# Patient Record
Sex: Female | Born: 1990 | Race: Black or African American | Hispanic: No | Marital: Single | Smoking: Former smoker
Health system: Southern US, Community
[De-identification: ages and names within clinical notes are randomized; demographics above are authoritative.]

## PROBLEM LIST (undated history)

## (undated) ENCOUNTER — Inpatient Hospital Stay (HOSPITAL_COMMUNITY): Payer: Self-pay

## (undated) DIAGNOSIS — D573 Sickle-cell trait: Secondary | ICD-10-CM

## (undated) DIAGNOSIS — A749 Chlamydial infection, unspecified: Secondary | ICD-10-CM

## (undated) HISTORY — PX: NO PAST SURGERIES: SHX2092

---
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
## 2012-09-07 ENCOUNTER — Emergency Department (INDEPENDENT_AMBULATORY_CARE_PROVIDER_SITE_OTHER)
Admission: EM | Admit: 2012-09-07 | Discharge: 2012-09-07 | Disposition: A | Discharge status: Home / Self Care | Payer: Medicaid Other | Source: Home / Self Care | Attending: Family Medicine | Admitting: Family Medicine

## 2012-09-07 ENCOUNTER — Encounter (HOSPITAL_COMMUNITY): Payer: Self-pay | Admitting: Emergency Medicine

## 2012-09-07 DIAGNOSIS — R109 Unspecified abdominal pain: Secondary | ICD-10-CM

## 2012-09-07 LAB — POCT URINALYSIS DIP (DEVICE)
Glucose, UA: NEGATIVE mg/dL
Ketones, ur: NEGATIVE mg/dL
Leukocytes, UA: NEGATIVE
Nitrite: NEGATIVE
Protein, ur: NEGATIVE mg/dL
Specific Gravity, Urine: 1.02 (ref 1.005–1.030)
Urobilinogen, UA: 0.2 mg/dL (ref 0.0–1.0)
pH: 5 (ref 5.0–8.0)

## 2012-09-07 MED ORDER — TAMSULOSIN HCL 0.4 MG PO CAPS: 0.4000 mg | ORAL_CAPSULE | Freq: Every day | ORAL | Status: DC

## 2012-09-07 MED ORDER — HYDROCODONE-ACETAMINOPHEN 10-500 MG PO TABS: 1.0000 | ORAL_TABLET | Freq: Three times a day (TID) | ORAL | Status: DC | PRN

## 2012-09-07 MED ORDER — METRONIDAZOLE 500 MG PO TABS: 500.0000 mg | ORAL_TABLET | Freq: Two times a day (BID) | ORAL | Status: DC

## 2012-09-07 MED ORDER — POLYETHYLENE GLYCOL 3350 17 G PO PACK: 17.0000 g | PACK | Freq: Every day | ORAL | Status: DC

## 2012-09-07 NOTE — ED Notes (Signed)
Reports abd pain with back pain for week now.  Denies discharge and problems with urine.   No OTC medications.

## 2012-09-07 NOTE — ED Provider Notes (Signed)
History     CSN: [id number]  Arrival date & time 09/07/12  1352   First MD Initiated Contact with Patient 09/07/12 1414      Chief Complaint  Patient presents with  . Abdominal Pain    (Consider location/radiation/quality/duration/timing/severity/associated sxs/prior treatment) HPI Comments: 21 year old female with no significant past medical history. Here complaining of right flank pain for about 5 days. Patient reports that her pain started in the right mid back and has been radiating down towards right flank in the last #2 days. Patient denies pelvic pain or vaginal discharge although reports bad fishlike smell in her genital area, urine and underwear. Denies dysuria or blood in her urine. No prior history of kidney stones. No frequency or urgency. No low abdominal discomfort. No nausea vomiting or diarrhea. No fever or chills.   History reviewed. No pertinent past medical history.  History reviewed. No pertinent past surgical history.  No family history on file.  History  Substance Use Topics  . Smoking status: Never Smoker   . Smokeless tobacco: Not on file  . Alcohol Use: No    OB History    Grav Para Term Preterm Abortions TAB SAB Ect Mult Living                  Review of Systems  HENT: Negative for sore throat.   Gastrointestinal: Positive for constipation. Negative for nausea and vomiting.  Genitourinary: Positive for flank pain. Negative for dysuria, frequency, vaginal discharge, vaginal pain, menstrual problem and pelvic pain.  Skin: Negative for rash.  Neurological: Negative for dizziness and headaches.    Allergies  Review of patient's allergies indicates no known allergies.  Home Medications   Current Outpatient Rx  Name  Route  Sig  Dispense  Refill  . HYDROCODONE-ACETAMINOPHEN 10-500 MG PO TABS   Oral   Take 1 tablet by mouth every 8 (eight) hours as needed for pain.   15 tablet   0   . METRONIDAZOLE 500 MG PO TABS   Oral   Take 1 tablet  (500 mg total) by mouth 2 (two) times daily.   14 tablet   0   . TAMSULOSIN HCL 0.4 MG PO CAPS   Oral   Take 1 capsule (0.4 mg total) by mouth daily.   7 capsule   0     BP 132/62  Pulse 76  Temp 98.8 F (37.1 C) (Oral)  Resp 19  SpO2 100%  Physical Exam  Nursing note and vitals reviewed. Constitutional: She is oriented to person, place, and time. She appears well-developed and well-nourished.  HENT:  Head: Normocephalic and atraumatic.  Mouth/Throat: Oropharynx is clear and moist. No oropharyngeal exudate.  Eyes: Conjunctivae normal are normal. No scleral icterus.  Neck: No thyromegaly present.  Cardiovascular: Normal heart sounds.   Pulmonary/Chest: Effort normal and breath sounds normal. She has no rales. She exhibits no tenderness.  Abdominal: Soft. She exhibits no distension and no mass. There is no rebound and no guarding.       Reported mild flank tenderness with deep palpation. impresss palpable stools in left colon with no tenderness.  Lymphadenopathy:    She has no cervical adenopathy.  Neurological: She is alert and oriented to person, place, and time.  Skin: No rash noted.    ED Course  Procedures (including critical care time)  Labs Reviewed  POCT URINALYSIS DIP (DEVICE) - Abnormal; Notable for the following:    Hgb urine dipstick TRACE (*)  All other components within normal limits  POCT PREGNANCY, URINE   No results found.   1. Flank pain       MDM  21 year old female here with right back and right flank pain for 5 days. No other symptoms. Negative pregnancy test. Trace hemoglobin with no LE or nitrates in the urine. A possible nephrolithiasis. Decided to treat with Vicodin and Flomax for few days, also prescribed MiraLax for constipation and Flagyl for possible BV. Patient was provided with a straining cup. Asked to go to the emergency department if new symptoms like vomiting or fever or persistent worsening pain otherwise followup with  urology as needed.       Sharin Grave, MD 09/09/12 1018

## 2012-12-07 ENCOUNTER — Emergency Department (HOSPITAL_COMMUNITY)
Admission: EM | Admit: 2012-12-07 | Discharge: 2012-12-07 | Disposition: A | Discharge status: Other Acute Inpatient Hospital | Payer: Self-pay | Attending: Emergency Medicine | Admitting: Emergency Medicine

## 2012-12-07 ENCOUNTER — Inpatient Hospital Stay (HOSPITAL_COMMUNITY)
Admission: AD | Admit: 2012-12-07 | Discharge: 2012-12-10 | DRG: 885 | Disposition: A | Discharge status: Home / Self Care | Payer: Federal, State, Local not specified - Other | Attending: Psychiatry | Admitting: Psychiatry

## 2012-12-07 ENCOUNTER — Encounter (HOSPITAL_COMMUNITY): Payer: Self-pay | Admitting: Emergency Medicine

## 2012-12-07 ENCOUNTER — Other Ambulatory Visit: Payer: Self-pay

## 2012-12-07 DIAGNOSIS — R443 Hallucinations, unspecified: Secondary | ICD-10-CM | POA: Insufficient documentation

## 2012-12-07 DIAGNOSIS — T394X2A Poisoning by antirheumatics, not elsewhere classified, intentional self-harm, initial encounter: Secondary | ICD-10-CM | POA: Insufficient documentation

## 2012-12-07 DIAGNOSIS — T40601A Poisoning by unspecified narcotics, accidental (unintentional), initial encounter: Secondary | ICD-10-CM | POA: Insufficient documentation

## 2012-12-07 DIAGNOSIS — F489 Nonpsychotic mental disorder, unspecified: Secondary | ICD-10-CM | POA: Insufficient documentation

## 2012-12-07 DIAGNOSIS — N390 Urinary tract infection, site not specified: Secondary | ICD-10-CM | POA: Insufficient documentation

## 2012-12-07 DIAGNOSIS — R45851 Suicidal ideations: Secondary | ICD-10-CM

## 2012-12-07 DIAGNOSIS — R45850 Homicidal ideations: Secondary | ICD-10-CM | POA: Insufficient documentation

## 2012-12-07 DIAGNOSIS — F329 Major depressive disorder, single episode, unspecified: Principal | ICD-10-CM | POA: Diagnosis present

## 2012-12-07 DIAGNOSIS — F32A Depression, unspecified: Secondary | ICD-10-CM | POA: Diagnosis present

## 2012-12-07 LAB — COMPREHENSIVE METABOLIC PANEL
ALT: 11 U/L (ref 0–35)
AST: 17 U/L (ref 0–37)
Albumin: 3.7 g/dL (ref 3.5–5.2)
BUN: 10 mg/dL (ref 6–23)
CO2: 21 mEq/L (ref 19–32)
Calcium: 9 mg/dL (ref 8.4–10.5)
Chloride: 104 mEq/L (ref 96–112)
Creatinine, Ser: 0.85 mg/dL (ref 0.50–1.10)
GFR calc Af Amer: >90 mL/min (ref >90)
GFR calc non Af Amer: >90 mL/min (ref >90)
Glucose, Bld: 95 mg/dL (ref 70–99)
Sodium: 137 mEq/L (ref 135–145)
Total Bilirubin: 0.3 mg/dL (ref 0.3–1.2)
Total Protein: 6.9 g/dL (ref 6.0–8.3)

## 2012-12-07 LAB — ACETAMINOPHEN LEVEL
Acetaminophen (Tylenol), Serum: <15 ug/mL (ref 10–30)
Acetaminophen (Tylenol), Serum: <15 ug/mL (ref 10–30)

## 2012-12-07 LAB — CBC WITH DIFFERENTIAL/PLATELET
Basophils Absolute: 0 10*3/uL (ref 0.0–0.1)
Basophils Relative: 0 % (ref 0–1)
Eosinophils Absolute: 0 10*3/uL (ref 0.0–0.7)
HCT: 34.1 % — ABNORMAL LOW (ref 36.0–46.0)
Hemoglobin: 11.5 g/dL — ABNORMAL LOW (ref 12.0–15.0)
Lymphocytes Relative: 29 % (ref 12–46)
Lymphs Abs: 1.8 10*3/uL (ref 0.7–4.0)
MCH: 25.7 pg — ABNORMAL LOW (ref 26.0–34.0)
MCHC: 33.7 g/dL (ref 30.0–36.0)
Monocytes Absolute: 0.4 10*3/uL (ref 0.1–1.0)
Monocytes Relative: 7 % (ref 3–12)
Neutro Abs: 4.1 10*3/uL (ref 1.7–7.7)
Neutrophils Relative %: 64 % (ref 43–77)
Platelets: 272 10*3/uL (ref 150–400)
RBC: 4.48 MIL/uL (ref 3.87–5.11)
RDW: 14.7 % (ref 11.5–15.5)

## 2012-12-07 LAB — URINALYSIS, ROUTINE W REFLEX MICROSCOPIC
Bilirubin Urine: NEGATIVE
Glucose, UA: NEGATIVE mg/dL
Hgb urine dipstick: NEGATIVE
Ketones, ur: >80 mg/dL — AB
Protein, ur: NEGATIVE mg/dL
Specific Gravity, Urine: 1.024 (ref 1.005–1.030)
Urobilinogen, UA: 1 mg/dL (ref 0.0–1.0)
pH: 5.5 (ref 5.0–8.0)

## 2012-12-07 LAB — RAPID URINE DRUG SCREEN, HOSP PERFORMED
Amphetamines: NOT DETECTED
Barbiturates: NOT DETECTED
Benzodiazepines: NOT DETECTED
Cocaine: NOT DETECTED
Tetrahydrocannabinol: NOT DETECTED

## 2012-12-07 LAB — URINE MICROSCOPIC-ADD ON

## 2012-12-07 MED ORDER — MAGNESIUM HYDROXIDE 400 MG/5ML PO SUSP: 30.0000 mL | Freq: Every day | ORAL | Status: DC | PRN

## 2012-12-07 MED ORDER — TRAZODONE HCL 50 MG PO TABS
50.0000 mg | ORAL_TABLET | Freq: Every evening | ORAL | Status: DC | PRN
  Filled 2012-12-07 (×7): qty 1

## 2012-12-07 MED ORDER — CIPROFLOXACIN HCL 500 MG PO TABS
500.0000 mg | ORAL_TABLET | Freq: Every day | ORAL | Status: AC
  Administered 2012-12-08 – 2012-12-10 (×3): 500 mg via ORAL
  Filled 2012-12-07 (×4): qty 1

## 2012-12-07 MED ORDER — CEPHALEXIN 500 MG PO CAPS
500.0000 mg | ORAL_CAPSULE | Freq: Four times a day (QID) | ORAL | Status: DC
  Administered 2012-12-07 (×2): 500 mg via ORAL
  Filled 2012-12-07 (×2): qty 1

## 2012-12-07 MED ORDER — ACETAMINOPHEN 325 MG PO TABS
650.0000 mg | ORAL_TABLET | Freq: Four times a day (QID) | ORAL | Status: DC | PRN
  Administered 2012-12-08 – 2012-12-10 (×5): 650 mg via ORAL

## 2012-12-07 MED ORDER — ALUM & MAG HYDROXIDE-SIMETH 200-200-20 MG/5ML PO SUSP: 30.0000 mL | ORAL | Status: DC | PRN
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
## 2012-12-07 NOTE — ED Notes (Signed)
Dr. Shirlee Latch at bedside talking with mother.
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
## 2012-12-07 NOTE — Progress Notes (Signed)
Patient ID: Brenda Simon, female   DOB: 1990-11-30, 22 y.o.   MRN: [id number]  Admission Note: Patient is a 22 yo female admitted voluntarily to [facility] for SI/HI. Pt states she made suicidal statements and sent a video to friends stating she was going to kill herself after a breakup with her boyfriend. Pt took 3 oxycodone tabs and had a plan to hang herself. Pt denies any thoughts of such now and says that is was in the heat of the moment and that she did not mean to say those things. Pt states she and her ex BF have broken up several times before and they have a rocky relationship but this time it is final. Pt states she is a full time student at [employer] and is falling behind in her schoolwork and it is a big stressor for her. Pt also says that she lives with her mother and sister and they are having to move into a new home on March 2nd which is overwhelming. Pt pleasant and appropriate during admission and verbally contracts for safety. Report given to Dois Davenport, [place].

## 2012-12-07 NOTE — ED Notes (Signed)
[id number]<ON> Expected date:<BR> Expected time:<BR> Means of arrival:<BR> Comments:<BR> 21yo took 4 hydrocodones/SI

## 2012-12-07 NOTE — ED Notes (Signed)
Patient in blue scrubs and red socks.  

## 2012-12-07 NOTE — Tx Team (Signed)
Initial Interdisciplinary Treatment Plan  PATIENT STRENGTHS: (choose at least two) Ability for insight Average or above average intelligence Capable of independent living Communication skills Financial means General fund of knowledge Motivation for treatment/growth Physical Health Supportive family/friends  PATIENT STRESSORS: Educational concerns   PROBLEM LIST: Problem List/Patient Goals Date to be addressed Date deferred Reason deferred Estimated date of resolution  Depression 12/07/12     Suicide Risk 12/07/12                                                DISCHARGE CRITERIA:  Reduction of life-threatening or endangering symptoms to within safe limits  PRELIMINARY DISCHARGE PLAN: Outpatient therapy Return to previous work or school arrangements  PATIENT/FAMIILY INVOLVEMENT: This treatment plan has been presented to and reviewed with the patient, Brenda Simon, and/or family member, .  The patient and family have been given the opportunity to ask questions and make suggestions.  Renaee Munda 12/07/2012, 10:52 PM

## 2012-12-07 NOTE — ED Notes (Signed)
Patient and belongings wanded by security. Patient has 1 bag of belongings located at nurses station.

## 2012-12-07 NOTE — ED Provider Notes (Signed)
I saw and evaluated the patient, reviewed the resident's note and I agree with the findings and plan.  Intentional overdose of 2-3 tablets of what appears to be hydrocodone/acetaminophen around 10 AM. Suicidal thoughts persists.  Labs including four-hour Tylenol level. Discuss with act team  Glynn Octave, MD 12/07/12 [phone]

## 2012-12-07 NOTE — ED Notes (Signed)
Patient has nose ring and belly button ring in. RN Tracie aware

## 2012-12-07 NOTE — ED Notes (Signed)
Patient to be discharge to [facility] with security and nurse tech.

## 2012-12-07 NOTE — BHH Counselor (Signed)
Brenda Simon, Child psychotherapist at [employer], submitted Pt for admission to [facility]. Thurman Coyer, [facility] confirmed bed availability. Donell Sievert, PA reviewed clinical information and accepted Pt to the service of Dr. Henrietta Dine, room 504-1.   Harlin Rain Patsy [place], LPC, [facility] Assessment Counselor

## 2012-12-07 NOTE — ED Notes (Signed)
Unknown amount of Hydrocodone 10/500 taken about 1000. Pt has been A&O x4 and was able to transfer herself to the stretcher upon arrival. EMS VS BP 108/68, HR 100 Resp 16, O2 Sat 94-98% on RA
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
## 2012-12-08 DIAGNOSIS — F329 Major depressive disorder, single episode, unspecified: Principal | ICD-10-CM

## 2012-12-08 NOTE — Progress Notes (Signed)
[facility] LCSW Aftercare Discharge Planning Group Note  12/08/2012 12:41 PM  Participation Quality:  Appropriate  Affect:  Appropriate and Depressed  Cognitive:  Appropriate  Insight:  Engaged  Engagement in Group:  Engaged  Modes of Intervention:  Exploration, Problem-solving, Rapport Building and Support  Summary of Progress/Problems:  Patient advised of admitting due to a suicide attempt.  She shared she is overwhelmed with school, break up with boyfriend and losing her job due to pending legal involvement.  She currently denies SI//HI and rates depression and anxiety at six.  She advised of living with her mother and not having outpatient follow up.  Daily workbook provided.   Wynn Banker 12/08/2012, 12:41 PM
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
## 2012-12-08 NOTE — Progress Notes (Signed)
Patient ID: Brenda Simon, female   DOB: [birth year], 22 y.o.   MRN: [id number] D: Pt. Introduced to Clinical research associate by admission nurse. A: Writer encouraged pt. To verbalize concerns. R: Pt. Reports no concerns at this time. Pt. Went to bed.

## 2012-12-08 NOTE — [facility] Counselor (Signed)
Adult Comprehensive Assessment  Patient ID: Brenda Simon, female   DOB: 01-08-1991, 22 y.o.   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 Family Relationships: Denies Surveyor, quantity / Lack of resources (include bankruptcy): Difficulty due to loss of job Housing / Lack of housing: Lives with mother and sister Physical health (include injuries & life threatening diseases): None Social relationships: None Substance abuse: None Bereavement / Loss: None  Living/Environment/Situation:  Living Arrangements: Parent Living conditions (as described by patient or guardian): Good How long has patient lived in current situation?: All of her life What is atmosphere in current home: Comfortable;Supportive  Family History:  Marital status: Single Does patient have children?: No  Childhood History:  By whom was/is the patient raised?: Mother/father and step-parent Additional childhood history information: Patient reports having a good childhood Description of patient's relationship with caregiver when they were a child: Good relationship with mother and stepfather Patient's description of current relationship with people who raised him/her: Good Does patient have siblings?: Yes Number of Siblings: 1 Description of patient's current relationship with siblings: Patient has a good relationship with sister. She reports brother is deceased. Did patient suffer any verbal/emotional/physical/sexual abuse as a child?: No Did patient suffer from severe childhood neglect?: No Has patient ever been sexually abused/assaulted/raped as an adolescent or adult?: No Was the patient ever a victim of a crime or a disaster?: No Witnessed domestic violence?: No Has patient been effected by domestic violence as an adult?:  No  Education:  Highest grade of school patient has completed: 12th Currently a student?: Yes How long has the patient attended?: [employer] Learning disability?: No  Employment/Work Situation:   Employment situation: Unemployed Patient's job has been impacted by current illness: No What is the longest time patient has a held a job?: 10 months Where was the patient employed at that time?: [employer] Has patient ever been in the [employer]?: No Has patient ever served in Buyer, retail?: No  Financial Resources:   Surveyor, quantity resources: No income Does patient have a Lawyer or guardian?: No  Alcohol/Substance Abuse:   What has been your use of drugs/alcohol within the last 12 months?: None If attempted suicide, did drugs/alcohol play a role in this?: No Alcohol/Substance Abuse Treatment Hx: Denies past history Has alcohol/substance abuse ever caused legal problems?: No  Social Support System:   Forensic psychologist System: None Type of faith/religion: None  Leisure/Recreation:   Leisure and Hobbies: Sherri Rad out with cousins  Strengths/Needs:   What things does the patient do well?: Unable to identify In what areas does patient struggle / problems for patient: Has problems talking with people/family about problems.  Discharge Plan:   Does patient have access to transportation?: Yes Will patient be returning to same living situation after discharge?: Yes Currently receiving community mental health services: No If no, would patient like referral for services when discharged?: Yes (What county?) (Will refer for services in [facility]) Does patient have financial barriers related to discharge medications?: Yes Patient description of barriers related to discharge medications: Patient does not have insurance or money to pay for medications.  Summary/Recommendations:  Brenda Simon is as 22 years old African American female admitted with Major Depression Disorder.  She will  Patient will benefit from crisis stabilization, evaluation for medication management, psycho education groups for coping skills development, group  therapy and assistance with discharge planning.     Brenda Simon, Joesph July. 12/08/2012

## 2012-12-08 NOTE — Progress Notes (Signed)
Adult Psychoeducational Group Note  Date:  12/08/2012 Time:  2000  Group Topic/Focus:  Karaoke  Participation Level:  Active  Participation Quality:  Appropriate, Attentive and Supportive  Affect:  Appropriate  Cognitive:  Alert and Appropriate  Insight: Good  Engagement in Group:  Engaged and Supportive  Modes of Intervention:  Support  Additional Comments:  Pt participated by singing a song and being supportive of her peers.   Humberto Seals Monique 12/08/2012, 10:19 PM

## 2012-12-08 NOTE — [facility] Suicide Risk Assessment (Signed)
Suicide Risk Assessment  Admission Assessment     Nursing information obtained from:  Patient Demographic factors:  Adolescent or young adult Current Mental Status:  NA (pt denies SI) Loss Factors:  Loss of significant relationship Historical Factors:  NA Risk Reduction Factors:  Sense of responsibility to family;Living with another person, especially a relative;Positive social support;Positive therapeutic relationship;Positive coping skills or problem solving skills  CLINICAL FACTORS:   Depression:   Anhedonia Hopelessness Impulsivity Insomnia Severe  COGNITIVE FEATURES THAT CONTRIBUTE TO RISK:  Thought constriction (tunnel vision)    SUICIDE RISK:   Moderate:  Frequent suicidal ideation with limited intensity, and duration, some specificity in terms of plans, no associated intent, good self-control, limited dysphoria/symptomatology, some risk factors present, and identifiable protective factors, including available and accessible social support.  PLAN OF CARE: Start medication as needed. Encourage patient to attend groups.  I certify that inpatient services furnished can reasonably be expected to improve the patient's condition.  Nilah Belcourt 12/08/2012, 11:32 AM

## 2012-12-08 NOTE — Progress Notes (Signed)
[facility] LCSW Aftercare Discharge Planning Group Note  12/08/2012 10:29 AM  Participation Quality:  Appropriate  Affect:  Anxious, Depressed and Flat  Cognitive:  Alert and Appropriate  Insight:  Developing/Improving  Engagement in Group:  Developing/Improving  Modes of Intervention:  Education, Limit-setting, Problem-solving, Rapport Building and Support  Summary of Progress/Problems:  Patient reports admitting to hospital following a suicide attempt.  She reports being stressed as a result of pressures from work, school and breakup with boyfriend.  Patient shared she was terminated from work due to having outstanding legal charges.  She denies SI/HI today and rates symptoms at six.  She denies alcohol/other drug use.  Patient advised she will return to mother's home at discharge.  Patient will need a referral for outpatient services.  Wynn Banker 12/08/2012, 10:29 AM

## 2012-12-08 NOTE — Progress Notes (Signed)
[facility] LCSW Group Therapy  Living a Balanced Life 1:15 - 2:30PM  12/08/2012 3:14 PM  Type of Therapy:  Group Therapy  Participation Level:  Active 3 Participation Quality:  Appropriate  Affect:  Appropriate, Depressed, Flat and Tearful  Cognitive:  Alert and Appropriate  Insight:  Engaged  Engagement in Therapy:  Engaged  Modes of Intervention:  Discussion, Education, Exploration, Problem-solving, Rapport Building and Support  Summary of Progress/Problems:  Patient struggled to identify what a balanced life would look like for her.  She shared was spending a lot of time at work and on school but not spending time on taking care of herself.  Wynn Banker 12/08/2012, 3:14 PM

## 2012-12-08 NOTE — Progress Notes (Signed)
Patient ID: Brenda Simon, female   DOB: 1991/05/05, 22 y.o.   MRN: [id number] D: pt. Visible on the unit, mostly on the phone. Pt. Reports depression at "4" of 10. Pt. Reports good family support, was visited tonight by "mom, aunts, cousins". Pt. Also reports coping skills i.e "listening to music, and praying a lot" Pt. Also requesting to signs 72 hr. Discharge release. A: Writer encouraged pt. To tap into positive reinforcer's and as she move forward to recovery. Staff will monitor q32min for safety. Writer encouraged group. Pt. Attended karaoke. R: pt. Is safe on the unit. Pt. Attended karaoke.

## 2012-12-08 NOTE — Progress Notes (Signed)
D: Patient appropriate and cooperative with staff and peers. Patient's affect/mood is sad and depressed. Patient complained of abdominal cramping 6/10. She reported on the self inventory sheet her appetite is good, energy level is low and ability to pay attention is good. Patient rated depression and feelings of hopelessness "7".  A: Support and encouragement provided to patient. Administered scheduled medications per ordering MD. Monitor Q15 minute checks for safety.  R: Patient receptive. Denies SI/HI/AVH. Patient remains safe.

## 2012-12-09 LAB — URINE CULTURE: Colony Count: >=100000

## 2012-12-09 NOTE — Progress Notes (Signed)
[facility] LCSW Group Therapy  Feelings Around Relapse1:15 - 2:30PM  12/09/2012 3:39 PM  Type of Therapy:  Group Therapy  Participation Level:  Active 3 Participation Quality:  Appropriate  Affect:  Appropriate  Cognitive:  Alert and Appropriate  Insight:  Engaged  Engagement in Therapy:  Engaged  Modes of Intervention:  Discussion, Education, Exploration, Problem-solving, Rapport Building and Support  Summary of Progress/Problems:  Patient shared relapse for her would be holding things in and not talking with family/friends about problems.  She shared she is looking forward to  Meeting with a counselor.  Wynn Banker 12/09/2012, 3:39 PM

## 2012-12-09 NOTE — Progress Notes (Signed)
[facility] INPATIENT:  Family/Significant Other Suicide Prevention Education  Suicide Prevention Education:  Education Completed; Shanedra, Lave, [phone] has been identified by the patient as the family member/significant other with whom the patient will be residing, and identified as the person(s) who will aid the patient in the event of a mental health crisis (suicidal ideations/suicide attempt).  With written consent from the patient, the family member/significant other has been provided the following suicide prevention education, prior to the and/or following the discharge of the patient.  The suicide prevention education provided includes the following:  Suicide risk factors  Suicide prevention and interventions  National Suicide Hotline telephone number  [facility] assessment telephone number  [facility] Emergency Assistance 911  [facility] and/or Residential Mobile Crisis Unit telephone number  Request made of family/significant other to:  Remove weapons (e.g., guns, rifles, knives), all items previously/currently identified as safety concern.  Mother reports there are no guns in the home.  Remove drugs/medications (over-the-counter, prescriptions, illicit drugs), all items previously/currently identified as a safety concern.  The family member/significant other verbalizes understanding of the suicide prevention education information provided.  The family member/significant other agrees to remove the items of safety concern listed above.  Wynn Banker 12/09/2012, 12:22 PM

## 2012-12-09 NOTE — Progress Notes (Signed)
Adult Psychoeducational Group Note  Date:  12/09/2012 Time:  11:51 AM  Group Topic/Focus:  Relapse Prevention Planning:   The focus of this group is to define relapse and discuss the need for planning to combat relapse.  Participation Level:  Minimal  Participation Quality:  Drowsy  Affect:  Blunted and depressed  Cognitive:  Oriented  Insight: Good  Engagement in Group:  Limited  Modes of Intervention:  Discussion and Education  Additional Comments:  Pt said her goal is to open up and talk to people. Pt feels that she needs to not keep things in so much.   Martine Trageser T 12/09/2012, 11:51 AM

## 2012-12-09 NOTE — Progress Notes (Signed)
D: Patient cooperative with staff and peers. Patient's affect/mood is appropriate to circumstance. Anxious at times. Patient verbalized that she "feels a whole lot better today" and that the groups are helpful. She reported on the self inventory sheet that her appetite is good, energy level is normal and ability to pay attention is good. Patient rated depression and feelings of hopelessness "0". Attending groups.  A: Support and encouragement provided to patient. Scheduled medications administered per MD orders. Maintain Q15 minute checks for safety.  R: Patient receptive. Denies SI/HI/AVH. Patient remains safe.

## 2012-12-09 NOTE — Progress Notes (Signed)
Patient has been up and active on the unit, attended group this evening and has voiced no complaints. Patient  Reports that her goal is to find coping skills and to talk with someone when feeling stressed. Patient currently denies having pain, -si/hi/a/v hall. Support and encouragement offered, safety maintained on unit, will continue to monitor.

## 2012-12-09 NOTE — Progress Notes (Signed)
Recreation Therapy Notes    Date: 02.28.2014 Time: 10:30am Location:[address]      Group Topic/Focus: Decision Making  Participation Level: Active  Participation Quality: Appropriate  Affect: Flat  Cognitive: Appropriate   Additional Comments: Patient given mind mapping activity. Patient was asked to identify the reason for admission and identify factors that played into reason for admission. As group patients identified coping mechanisms. Patient shared that her reason for admission is depression. Patient stated that it is hard to communicate how she is feeling to others and this makes her depression worse. Patient stated she might try the positive affirmation coping mechanism. LRT encouraged patient to learn as many coping skills as possible while here in the hospital.   Jearl Klinefelter, LRT/CTRS    Jearl Klinefelter 12/09/2012 4:10 PM

## 2012-12-09 NOTE — Progress Notes (Signed)
[facility] LCSW Aftercare Discharge Planning Group Note  12/09/2012 10:07 AM  Participation Quality:  Appropriate  Affect:  Appropriate  Cognitive:  Appropriate  Insight:  Engaged  Engagement in Group:  Engaged  Modes of Intervention:  Exploration, Problem-solving, Rapport Building and Support  Summary of Progress/Problems:  Patient advised of feeling much better today.  She denies SI/HI and rates all symptoms at zero.  Patient shared she plans to move forward with her life and put the past behind her.   Daily workbook provided.   Wynn Banker 12/09/2012, 10:07 AM

## 2012-12-09 NOTE — Progress Notes (Signed)
Patient ID: Brenda Simon, female   DOB: 02-15-1991, 22 y.o.   MRN: [id number] Pt. Signed 72 hour discharge release. Writer reviewed request and notified pt. That physician will review in a.m.

## 2012-12-09 NOTE — Progress Notes (Signed)
[facility] Adult Case Management Discharge Plan :  Will you be returning to the same living situation after discharge: Yes,  Patient to return to mother's home. At discharge, do you have transportation home?:Yes,  Patient to arrange transportation home. Do you have the ability to pay for your medications:  No.  Patient does not want to be on medications.  Release of information consent forms completed and in the chart;  Patient's signature needed at discharge.  Patient to Follow up at: Follow-up Information   Follow up with [facility] On 12/16/2012. (You are scheduled with [facility] on Friday, March 7 , 2014 at 11:00 AM)    Contact information:   [address] [place], [place]   [address]  [phone]      Patient denies SI/HI:   Yes,  Patient is not endorsing SI/HI or thoughts of self harm.    Safety Planning and Suicide Prevention discussed:  Yes,  Reviewed during aftercare group.  Wynn Banker 12/09/2012, 10:45 AM

## 2012-12-09 NOTE — Tx Team (Signed)
Interdisciplinary Treatment Plan Update   Date Reviewed:  12/09/2012  Time Reviewed:  10:24 AM  Progress in Treatment:   Attending groups: Yes Participating in groups: Yes Taking medication as prescribed: Yes  Tolerating medication: Yes Family/Significant other contact made: Contact to be made with mother. Patient understands diagnosis: Yes  Discussing patient identified problems/goals with staff: Yes Medical problems stabilized or resolved: Yes Denies suicidal/homicidal ideation: Yes Patient has not harmed self or others: Yes  For review of initial/current patient goals, please see plan of care.  Estimated Length of Stay:  1 - day  Reasons for Continued Hospitalization:  Anxiety Depression Suicidal ideation  New Problems/Goals identified:    Discharge Plan or Barriers:   Home with [facility]  Additional Comments:  Patient reports admitted to hospital for SI.  She rates all symptoms at zero.  Paitent shared she does not want to be started on medications but is willing to see a therapist.    Attendees:  Patient: Brenda Simon 12/09/2012 10:24 AM   Signature: Patrick North, MD 12/09/2012 10:24 AM  Signature: 12/09/2012 10:24 AM  Signature: Harold Barban, RN 12/09/2012 10:24 AM  Signature: 12/09/2012 10:24 AM  Signature:  12/09/2012 10:24 AM  Signature:  Juline Patch, LCSW 12/09/2012 10:24 AM  Signature: Silverio Decamp, PMH-NP 12/09/2012 10:24 AM  Signature:  12/09/2012 10:24 AM  Signature:    Signature:    Signature:    Signature:      Scribe for Treatment Team:   Juline Patch,  12/09/2012 10:24 AM

## 2012-12-09 NOTE — Progress Notes (Signed)
[facility] MD Progress Note  12/09/2012 11:55 AM Brenda Simon  MRN:  [id number] Subjective:  Patient continues to be depressed and anxious. States it is difficult for her to be around people and she needs to learn coping skills. Continues to be resistant to medication. Diagnosis:   Axis I: Major Depression, single episode Axis II: Deferred Axis III:  Past Medical History  Diagnosis Date  . Medical history non-contributory    Axis IV: other psychosocial or environmental problems Axis V: 41-50 serious symptoms  ADL's:  Intact  Sleep: Fair  Appetite:  Fair   Psychiatric Specialty Exam: Review of Systems  Constitutional: Negative.   HENT: Negative.   Eyes: Negative.   Respiratory: Negative.   Cardiovascular: Negative.   Gastrointestinal: Negative.   Genitourinary: Negative.   Skin: Negative.   Neurological: Negative.   Endo/Heme/Allergies: Negative.   Psychiatric/Behavioral: Positive for depression. The patient is nervous/anxious.     Blood pressure 104/70, pulse 112, temperature 98.2 F (36.8 C), temperature source Oral, resp. rate 16, height 5\' 3"  (1.6 m), weight 71.215 kg (157 lb), last menstrual period 11/12/2012.Body mass index is 27.82 kg/(m^2).  General Appearance: Casual  Eye Contact::  Fair  Speech:  Slow  Volume:  Decreased  Mood:  Anxious and Dysphoric  Affect:  Constricted and Flat  Thought Process:  Circumstantial  Orientation:  Full (Time, Place, and Person)  Thought Content:  Rumination  Suicidal Thoughts:  No  Homicidal Thoughts:  No  Memory:  Immediate;   Fair Recent;   Fair Remote;   Fair  Judgement:  Fair  Insight:  Present  Psychomotor Activity:  Decreased  Concentration:  Fair  Recall:  Fair  Akathisia:  No  Handed:  Right  AIMS (if indicated):     Assets:  Communication Skills Desire for Improvement Housing Social Support  Sleep:  Number of Hours: 6.25   Current Medications: Current Facility-Administered Medications  Medication Dose  Route Frequency Provider Last Rate Last Dose  . acetaminophen (TYLENOL) tablet 650 mg  650 mg Oral Q6H PRN Kerry Hough, PA   650 mg at 12/08/12 2128  . alum & mag hydroxide-simeth (MAALOX/MYLANTA) 200-200-20 MG/5ML suspension 30 mL  30 mL Oral Q4H PRN Kerry Hough, PA      . ciprofloxacin (CIPRO) tablet 500 mg  500 mg Oral Daily Kerry Hough, PA   500 mg at 12/09/12 0751  . magnesium hydroxide (MILK OF MAGNESIA) suspension 30 mL  30 mL Oral Daily PRN Kerry Hough, PA      . traZODone (DESYREL) tablet 50 mg  50 mg Oral QHS,MR X 1 Kerry Hough, [place]        Lab Results:  Results for orders placed during the hospital encounter of 12/07/12 (from the past 48 hour(s))  ACETAMINOPHEN LEVEL     Status: None   Collection Time    12/07/12 11:57 AM      Result Value Range   Acetaminophen (Tylenol), Serum <15.0  10 - 30 ug/mL   Comment:            THERAPEUTIC CONCENTRATIONS VARY     SIGNIFICANTLY. A RANGE OF 10-30     ug/mL MAY BE AN EFFECTIVE     CONCENTRATION FOR MANY PATIENTS.     HOWEVER, SOME ARE BEST TREATED     AT CONCENTRATIONS OUTSIDE THIS     RANGE.     ACETAMINOPHEN CONCENTRATIONS     >150 ug/mL AT 4 HOURS AFTER  INGESTION AND >50 ug/mL AT 12     HOURS AFTER INGESTION ARE     OFTEN ASSOCIATED WITH TOXIC     REACTIONS.  ETHANOL     Status: None   Collection Time    12/07/12 11:57 AM      Result Value Range   Alcohol, Ethyl (B) <11  0 - 11 mg/dL   Comment:            LOWEST DETECTABLE LIMIT FOR     SERUM ALCOHOL IS 11 mg/dL     FOR MEDICAL PURPOSES ONLY  COMPREHENSIVE METABOLIC PANEL     Status: Abnormal   Collection Time    12/07/12 11:57 AM      Result Value Range   Sodium 137  135 - 145 mEq/L   Potassium 3.4 (*) 3.5 - 5.1 mEq/L   Chloride 104  96 - 112 mEq/L   CO2 21  19 - 32 mEq/L   Glucose, Bld 95  70 - 99 mg/dL   BUN 10  6 - 23 mg/dL   Creatinine, Ser 1.61  0.50 - 1.10 mg/dL   Calcium 9.0  8.4 - 09.6 mg/dL   Total Protein 6.9  6.0 - 8.3 g/dL    Albumin 3.7  3.5 - 5.2 g/dL   AST 17  0 - 37 U/L   ALT 11  0 - 35 U/L   Alkaline Phosphatase 72  39 - 117 U/L   Total Bilirubin 0.3  0.3 - 1.2 mg/dL   GFR calc non Af Amer >90  >90 mL/min   GFR calc Af Amer >90  >90 mL/min   Comment:            The eGFR has been calculated     using the CKD EPI equation.     This calculation has not been     validated in all clinical     situations.     eGFR's persistently     <90 mL/min signify     possible Chronic Kidney Disease.  CBC WITH DIFFERENTIAL     Status: Abnormal   Collection Time    12/07/12 11:57 AM      Result Value Range   WBC 6.3  4.0 - 10.5 K/uL   RBC 4.48  3.87 - 5.11 MIL/uL   Hemoglobin 11.5 (*) 12.0 - 15.0 g/dL   HCT 04.5 (*) 40.9 - 81.1 %   MCV 76.1 (*) 78.0 - 100.0 fL   MCH 25.7 (*) 26.0 - 34.0 pg   MCHC 33.7  30.0 - 36.0 g/dL   RDW 91.4  78.2 - 95.6 %   Platelets 272  150 - 400 K/uL   Neutrophils Relative 64  43 - 77 %   Neutro Abs 4.1  1.7 - 7.7 K/uL   Lymphocytes Relative 29  12 - 46 %   Lymphs Abs 1.8  0.7 - 4.0 K/uL   Monocytes Relative 7  3 - 12 %   Monocytes Absolute 0.4  0.1 - 1.0 K/uL   Eosinophils Relative 0  0 - 5 %   Eosinophils Absolute 0.0  0.0 - 0.7 K/uL   Basophils Relative 0  0 - 1 %   Basophils Absolute 0.0  0.0 - 0.1 K/uL  URINE RAPID DRUG SCREEN (HOSP PERFORMED)     Status: Abnormal   Collection Time    12/07/12 12:15 PM      Result Value Range   Opiates POSITIVE (*) NONE DETECTED  Cocaine NONE DETECTED  NONE DETECTED   Benzodiazepines NONE DETECTED  NONE DETECTED   Amphetamines NONE DETECTED  NONE DETECTED   Tetrahydrocannabinol NONE DETECTED  NONE DETECTED   Barbiturates NONE DETECTED  NONE DETECTED   Comment:            DRUG SCREEN FOR MEDICAL PURPOSES     ONLY.  IF CONFIRMATION IS NEEDED     FOR ANY PURPOSE, NOTIFY LAB     WITHIN 5 DAYS.                LOWEST DETECTABLE LIMITS     FOR URINE DRUG SCREEN     Drug Class       Cutoff (ng/mL)     Amphetamine      1000      Barbiturate      200     Benzodiazepine   200     Tricyclics       300     Opiates          300     Cocaine          300     THC              50  URINALYSIS, ROUTINE W REFLEX MICROSCOPIC     Status: Abnormal   Collection Time    12/07/12 12:15 PM      Result Value Range   Color, Urine YELLOW  YELLOW   APPearance CLOUDY (*) CLEAR   Specific Gravity, Urine 1.024  1.005 - 1.030   pH 5.5  5.0 - 8.0   Glucose, UA NEGATIVE  NEGATIVE mg/dL   Hgb urine dipstick NEGATIVE  NEGATIVE   Bilirubin Urine NEGATIVE  NEGATIVE   Ketones, ur >80 (*) NEGATIVE mg/dL   Protein, ur NEGATIVE  NEGATIVE mg/dL   Urobilinogen, UA 1.0  0.0 - 1.0 mg/dL   Nitrite POSITIVE (*) NEGATIVE   Leukocytes, UA MODERATE (*) NEGATIVE  URINE MICROSCOPIC-ADD ON     Status: Abnormal   Collection Time    12/07/12 12:15 PM      Result Value Range   Squamous Epithelial / LPF FEW (*) RARE   WBC, UA 11-20  <3 WBC/hpf   Bacteria, UA MANY (*) RARE  URINE CULTURE     Status: None   Collection Time    12/07/12 12:15 PM      Result Value Range   Specimen Description URINE, CLEAN CATCH     Special Requests NONE     Culture  Setup Time 12/07/2012 19:03     Colony Count >=100,000 COLONIES/ML     Culture ESCHERICHIA COLI     Report Status 12/09/2012 FINAL     Organism ID, Bacteria ESCHERICHIA COLI    ACETAMINOPHEN LEVEL     Status: None   Collection Time    12/07/12  1:54 PM      Result Value Range   Acetaminophen (Tylenol), Serum <15.0  10 - 30 ug/mL   Comment:            THERAPEUTIC CONCENTRATIONS VARY     SIGNIFICANTLY. A RANGE OF 10-30     ug/mL MAY BE AN EFFECTIVE     CONCENTRATION FOR MANY PATIENTS.     HOWEVER, SOME ARE BEST TREATED     AT CONCENTRATIONS OUTSIDE THIS     RANGE.     ACETAMINOPHEN CONCENTRATIONS     >150 ug/mL AT 4 HOURS AFTER     INGESTION  AND >50 ug/mL AT 12     HOURS AFTER INGESTION ARE     OFTEN ASSOCIATED WITH TOXIC     REACTIONS.    Physical Findings: AIMS: Facial and Oral  Movements Muscles of Facial Expression: None, normal Lips and Perioral Area: None, normal Jaw: None, normal Tongue: None, normal,Extremity Movements Upper (arms, wrists, hands, fingers): None, normal Lower (legs, knees, ankles, toes): None, normal, Trunk Movements Neck, shoulders, hips: None, normal, Overall Severity Severity of abnormal movements (highest score from questions above): None, normal Incapacitation due to abnormal movements: None, normal Patient's awareness of abnormal movements (rate only patient's report): No Awareness, Dental Status Current problems with teeth and/or dentures?: No Does patient usually wear dentures?: No  CIWA:    COWS:     Treatment Plan Summary: Daily contact with patient to assess and evaluate symptoms and progress in treatment Medication management  Plan: Educated patient once again about taking medication to help with mood and anxiety symptoms. Patient encouarged to attend groups and learn coping skills to deal with her mood/anxiety symptoms. Discharge tomorrow if stable.  Medical Decision Making Problem Points:  Established problem, stable/improving (1), Review of last therapy session (1) and Review of psycho-social stressors (1) Data Points:  Review of medication regiment & side effects (2)  I certify that inpatient services furnished can reasonably be expected to improve the patient's condition.   Verner Kopischke 12/09/2012, 11:55 AM

## 2012-12-10 DIAGNOSIS — F329 Major depressive disorder, single episode, unspecified: Secondary | ICD-10-CM | POA: Diagnosis present

## 2012-12-10 DIAGNOSIS — F3289 Other specified depressive episodes: Secondary | ICD-10-CM

## 2012-12-10 DIAGNOSIS — F32A Depression, unspecified: Secondary | ICD-10-CM | POA: Diagnosis present

## 2012-12-10 MED ORDER — TRAZODONE HCL 50 MG PO TABS: 50.0000 mg | ORAL_TABLET | Freq: Every evening | ORAL | Status: DC | PRN

## 2012-12-10 NOTE — Discharge Summary (Signed)
Physician Discharge Summary Note  Patient:  Brenda Simon is an 22 y.o., female MRN:  [id number] DOB:  [birth year] Patient phone:  [phone] (home)  Patient address:   [address] [place] [address],   Date of Admission:  12/07/2012 Date of Discharge: 12/11/2038  Reason for Admission:  Patient was admitted after feeling depressed and broke up with the boyfriend.  She endorse financial distress.  She took 5 pills of hydrocodone and trying to hang herself.  Patient endorse she was upset and wanted some attention.  Discharge Diagnoses: Active Problems:   * No active hospital problems. *  Review of Systems  Constitutional: Negative.   Neurological: Negative.   Psychiatric/Behavioral: Positive for depression. Negative for suicidal ideas, hallucinations and substance abuse. The patient is nervous/anxious. The patient does not have insomnia.    Axis Diagnosis:   AXIS I:  Depressive Disorder NOS AXIS II:  Deferred AXIS III:   Past Medical History  Diagnosis Date  . Medical history non-contributory    AXIS IV:  economic problems AXIS V:  61-70 mild symptoms  Level of Care:  OP  Hospital Course:  Patient was admitted in the unit.  She was encouraged to participate in group milieu therapy.  Psychotropic medication was offered but patient declined.  Patient felt much improvement after she participated in group milieu therapy.  She realized it was a mistake and impulsive.  She wants to live.  She was not a management problem at hospital.  She sleeping better.  She denies any crying spells.  She denies any active or passive suicidal thoughts or homicidal thoughts.  She wants to be discharged.  She is very motivated to do outpatient therapy.  Consults:  None  Significant Diagnostic Studies:  None  Discharge Vitals:   Blood pressure 104/70, pulse 112, temperature 98.2 F (36.8 C), temperature source Oral, resp. rate 16, height 5\' 3"  (1.6 m), weight 71.215 kg (157 lb), last menstrual  period 11/12/2012. Body mass index is 27.82 kg/(m^2). Lab Results:   Results for orders placed during the hospital encounter of 12/07/12 (from the past 72 hour(s))  ACETAMINOPHEN LEVEL     Status: None   Collection Time    12/07/12 11:57 AM      Result Value Range   Acetaminophen (Tylenol), Serum <15.0  10 - 30 ug/mL   Comment:            THERAPEUTIC CONCENTRATIONS VARY     SIGNIFICANTLY. A RANGE OF 10-30     ug/mL MAY BE AN EFFECTIVE     CONCENTRATION FOR MANY PATIENTS.     HOWEVER, SOME ARE BEST TREATED     AT CONCENTRATIONS OUTSIDE THIS     RANGE.     ACETAMINOPHEN CONCENTRATIONS     >150 ug/mL AT 4 HOURS AFTER     INGESTION AND >50 ug/mL AT 12     HOURS AFTER INGESTION ARE     OFTEN ASSOCIATED WITH TOXIC     REACTIONS.  ETHANOL     Status: None   Collection Time    12/07/12 11:57 AM      Result Value Range   Alcohol, Ethyl (B) <11  0 - 11 mg/dL   Comment:            LOWEST DETECTABLE LIMIT FOR     SERUM ALCOHOL IS 11 mg/dL     FOR MEDICAL PURPOSES ONLY  COMPREHENSIVE METABOLIC PANEL     Status: Abnormal   Collection Time  12/07/12 11:57 AM      Result Value Range   Sodium 137  135 - 145 mEq/L   Potassium 3.4 (*) 3.5 - 5.1 mEq/L   Chloride 104  96 - 112 mEq/L   CO2 21  19 - 32 mEq/L   Glucose, Bld 95  70 - 99 mg/dL   BUN 10  6 - 23 mg/dL   Creatinine, Ser 8.29  0.50 - 1.10 mg/dL   Calcium 9.0  8.4 - 56.2 mg/dL   Total Protein 6.9  6.0 - 8.3 g/dL   Albumin 3.7  3.5 - 5.2 g/dL   AST 17  0 - 37 U/L   ALT 11  0 - 35 U/L   Alkaline Phosphatase 72  39 - 117 U/L   Total Bilirubin 0.3  0.3 - 1.2 mg/dL   GFR calc non Af Amer >90  >90 mL/min   GFR calc Af Amer >90  >90 mL/min   Comment:            The eGFR has been calculated     using the CKD EPI equation.     This calculation has not been     validated in all clinical     situations.     eGFR's persistently     <90 mL/min signify     possible Chronic Kidney Disease.  CBC WITH DIFFERENTIAL     Status: Abnormal    Collection Time    12/07/12 11:57 AM      Result Value Range   WBC 6.3  4.0 - 10.5 K/uL   RBC 4.48  3.87 - 5.11 MIL/uL   Hemoglobin 11.5 (*) 12.0 - 15.0 g/dL   HCT 13.0 (*) 86.5 - 78.4 %   MCV 76.1 (*) 78.0 - 100.0 fL   MCH 25.7 (*) 26.0 - 34.0 pg   MCHC 33.7  30.0 - 36.0 g/dL   RDW 69.6  29.5 - 28.4 %   Platelets 272  150 - 400 K/uL   Neutrophils Relative 64  43 - 77 %   Neutro Abs 4.1  1.7 - 7.7 K/uL   Lymphocytes Relative 29  12 - 46 %   Lymphs Abs 1.8  0.7 - 4.0 K/uL   Monocytes Relative 7  3 - 12 %   Monocytes Absolute 0.4  0.1 - 1.0 K/uL   Eosinophils Relative 0  0 - 5 %   Eosinophils Absolute 0.0  0.0 - 0.7 K/uL   Basophils Relative 0  0 - 1 %   Basophils Absolute 0.0  0.0 - 0.1 K/uL  URINE RAPID DRUG SCREEN (HOSP PERFORMED)     Status: Abnormal   Collection Time    12/07/12 12:15 PM      Result Value Range   Opiates POSITIVE (*) NONE DETECTED   Cocaine NONE DETECTED  NONE DETECTED   Benzodiazepines NONE DETECTED  NONE DETECTED   Amphetamines NONE DETECTED  NONE DETECTED   Tetrahydrocannabinol NONE DETECTED  NONE DETECTED   Barbiturates NONE DETECTED  NONE DETECTED   Comment:            DRUG SCREEN FOR MEDICAL PURPOSES     ONLY.  IF CONFIRMATION IS NEEDED     FOR ANY PURPOSE, NOTIFY LAB     WITHIN 5 DAYS.                LOWEST DETECTABLE LIMITS     FOR URINE DRUG SCREEN     Drug Class  Cutoff (ng/mL)     Amphetamine      1000     Barbiturate      200     Benzodiazepine   200     Tricyclics       300     Opiates          300     Cocaine          300     THC              50  URINALYSIS, ROUTINE W REFLEX MICROSCOPIC     Status: Abnormal   Collection Time    12/07/12 12:15 PM      Result Value Range   Color, Urine YELLOW  YELLOW   APPearance CLOUDY (*) CLEAR   Specific Gravity, Urine 1.024  1.005 - 1.030   pH 5.5  5.0 - 8.0   Glucose, UA NEGATIVE  NEGATIVE mg/dL   Hgb urine dipstick NEGATIVE  NEGATIVE   Bilirubin Urine NEGATIVE  NEGATIVE    Ketones, ur >80 (*) NEGATIVE mg/dL   Protein, ur NEGATIVE  NEGATIVE mg/dL   Urobilinogen, UA 1.0  0.0 - 1.0 mg/dL   Nitrite POSITIVE (*) NEGATIVE   Leukocytes, UA MODERATE (*) NEGATIVE  URINE MICROSCOPIC-ADD ON     Status: Abnormal   Collection Time    12/07/12 12:15 PM      Result Value Range   Squamous Epithelial / LPF FEW (*) RARE   WBC, UA 11-20  <3 WBC/hpf   Bacteria, UA MANY (*) RARE  URINE CULTURE     Status: None   Collection Time    12/07/12 12:15 PM      Result Value Range   Specimen Description URINE, CLEAN CATCH     Special Requests NONE     Culture  Setup Time 12/07/2012 19:03     Colony Count >=100,000 COLONIES/ML     Culture ESCHERICHIA COLI     Report Status 12/09/2012 FINAL     Organism ID, Bacteria ESCHERICHIA COLI    ACETAMINOPHEN LEVEL     Status: None   Collection Time    12/07/12  1:54 PM      Result Value Range   Acetaminophen (Tylenol), Serum <15.0  10 - 30 ug/mL   Comment:            THERAPEUTIC CONCENTRATIONS VARY     SIGNIFICANTLY. A RANGE OF 10-30     ug/mL MAY BE AN EFFECTIVE     CONCENTRATION FOR MANY PATIENTS.     HOWEVER, SOME ARE BEST TREATED     AT CONCENTRATIONS OUTSIDE THIS     RANGE.     ACETAMINOPHEN CONCENTRATIONS     >150 ug/mL AT 4 HOURS AFTER     INGESTION AND >50 ug/mL AT 12     HOURS AFTER INGESTION ARE     OFTEN ASSOCIATED WITH TOXIC     REACTIONS.    Physical Findings: AIMS: Facial and Oral Movements Muscles of Facial Expression: None, normal Lips and Perioral Area: None, normal Jaw: None, normal Tongue: None, normal,Extremity Movements Upper (arms, wrists, hands, fingers): None, normal Lower (legs, knees, ankles, toes): None, normal, Trunk Movements Neck, shoulders, hips: None, normal, Overall Severity Severity of abnormal movements (highest score from questions above): None, normal Incapacitation due to abnormal movements: None, normal Patient's awareness of abnormal movements (rate only patient's report): No  Awareness, Dental Status Current problems with teeth and/or dentures?: No Does patient usually wear  dentures?: No  CIWA:    COWS:     Psychiatric Specialty Exam: See Psychiatric Specialty Exam and Suicide Risk Assessment completed by Attending Physician prior to discharge.  Discharge destination:  Other:  Patient will see Tamala Ser on March 7 for counseling.  Is patient on multiple antipsychotic therapies at discharge:  No   Has Patient had three or more failed trials of antipsychotic monotherapy by history:  No  Recommended Plan for Multiple Antipsychotic Therapies: Taper to monotherapy as described:  Patient is not taking any psychotropic medication at this time.  Discharge Orders   Future Orders Complete By Expires     Activity as tolerated - No restrictions  As directed     Diet - low sodium heart healthy  As directed         Medication List     As of 12/10/2012  8:44 AM    Notice      You have not been prescribed any medications.            Follow-up Information   [facility] up with [facility] On 12/16/2012. (You are scheduled with [facility] on Friday, March 7 , 2014 at 11:00 AM)    Contact information:   [address] [place], [place]   [address]  [phone]      Follow-up recommendations:  Activity:  As tolerated Diet:  Regular Tests:  None  Comments:  None  Total Discharge Time:  Greater than 30 minutes.  Signed: Martice Doty T. 12/10/2012, 8:44 AM

## 2012-12-10 NOTE — Progress Notes (Signed)
D) Pt being discharged to home accompanied by her family. Affect and mood are appropriate. Denies SI and HI. Realizes that she is going to have to work through a lot of loss. States she feels that she will be able to do it while seeing a therapist. A) Given support and reassurance. All belongings returned to Pt. Pt was not discharged with meds. All follow up plans explained to Pt. R) Denies SI and HI. Plans to follow up at [facility].

## 2012-12-10 NOTE — Progress Notes (Signed)
[facility] Group Notes:  (Nursing/MHT/Case Management/Adjunct)  Date:  12/09/2012 Time:  2000  Type of Therapy:  Psychoeducational Skills  Participation Level:  Active  Participation Quality:  Appropriate  Affect:  Appropriate  Cognitive:  Appropriate  Insight:  Good  Engagement in Group:  Engaged  Modes of Intervention:  Education  Summary of Progress/Problems: The patient stated that she had a "better day" than yesterday. She stated that she will be discharged tomorrow and that she has learned a number of things. For one, she mentioned that "life is too short" and that she regretted her actions prior to entering the hospital. Secondly, she acknowledged that she needs to work on finding more coping skills.   Malichi Palardy S 12/10/2012, 1:09 AM

## 2012-12-10 NOTE — [facility] Suicide Risk Assessment (Signed)
Suicide Risk Assessment  Discharge Assessment     Demographic Factors:  Low socioeconomic status  Mental Status Per Nursing Assessment::   On Admission:  NA (pt denies SI)  Current Mental Status by Physician: Patient is a young female who is fairly groomed and dressed.  She maintained good eye contact.  Her speech is soft clear and coherent.  She denies any active or passive suicidal thoughts or homicidal thoughts.  There were no psychotic symptoms present at this time.  She feel good about discharge.  She participated in group therapy.  She does not want to take any psychotropic medication.  There were no delusion paranoia or hallucination present at this time.  Loss Factors: Financial problems/change in socioeconomic status  Historical Factors: Impulsivity  Risk Reduction Factors:   Living with another person, especially a relative, Positive social support, Positive therapeutic relationship and Positive coping skills or problem solving skills  Continued Clinical Symptoms:  Dysthymia  Cognitive Features That Contribute To Risk:  Closed-mindedness    Suicide Risk:  Minimal: No identifiable suicidal ideation.  Patients presenting with no risk factors but with morbid ruminations; may be classified as minimal risk based on the severity of the depressive symptoms  Discharge Diagnoses:   AXIS I:  Major Depression, single episode AXIS II:  Deferred AXIS III:   Past Medical History  Diagnosis Date  . Medical history non-contributory    AXIS IV:  economic problems AXIS V:  61-70 mild symptoms  Plan Of Care/Follow-up recommendations:  Activity:  Full Diet:  Regular Tests:  None Other:  Patient is not interested taking any psychotropic medication.  She feels much improvement after participated in groups.  Is patient on multiple antipsychotic therapies at discharge:  No   Has Patient had three or more failed trials of antipsychotic monotherapy by history:  No  Recommended  Plan for Multiple Antipsychotic Therapies: Taper to monotherapy as described:  Patient is not taking any psychotropic medication at this time.  Richrd Kuzniar T. 12/10/2012, 8:40 AM

## 2012-12-10 NOTE — Progress Notes (Signed)
Goals Group Note  Date:  12/10/2012 Time: 1015  Group Topic/Focus:  Identifying Needs:   The focus of this group is to help patients identify their personal needs that have been historically problematic and identify healthy behaviors to address their needs.  Participation Level:  Active  Participation Quality: good  Affect: flat  Cognitive:good    Insight:  good  Engagement in Group: engaged  Additional Comments:   PDuke RN [employer]

## 2012-12-14 NOTE — Progress Notes (Signed)
Patient Discharge Instructions:  After Visit Summary (AVS):   Faxed to:  12/14/12 Discharge Summary Note:   Faxed to:  12/14/12 Psychiatric Admission Assessment Note:   Faxed to:  12/14/12 Suicide Risk Assessment - Discharge Assessment:   Faxed to:  12/14/12 Faxed/Sent to the Next Level Care provider:  12/14/12 Faxed to [facility] @ [phone]  Jerelene Redden, 12/14/2012, 1:57 PM

## 2014-08-01 ENCOUNTER — Emergency Department (HOSPITAL_COMMUNITY)
Admission: EM | Admit: 2014-08-01 | Discharge: 2014-08-01 | Disposition: A | Discharge status: Home / Self Care | Payer: No Typology Code available for payment source | Attending: Emergency Medicine | Admitting: Emergency Medicine

## 2014-08-01 ENCOUNTER — Encounter (HOSPITAL_COMMUNITY): Payer: Self-pay | Admitting: Emergency Medicine

## 2014-08-01 DIAGNOSIS — Z3202 Encounter for pregnancy test, result negative: Secondary | ICD-10-CM | POA: Diagnosis not present

## 2014-08-01 DIAGNOSIS — S39012A Strain of muscle, fascia and tendon of lower back, initial encounter: Secondary | ICD-10-CM | POA: Diagnosis not present

## 2014-08-01 DIAGNOSIS — Y92410 Unspecified street and highway as the place of occurrence of the external cause: Secondary | ICD-10-CM | POA: Insufficient documentation

## 2014-08-01 DIAGNOSIS — Y9389 Activity, other specified: Secondary | ICD-10-CM | POA: Diagnosis not present

## 2014-08-01 DIAGNOSIS — S3992XA Unspecified injury of lower back, initial encounter: Secondary | ICD-10-CM | POA: Diagnosis present

## 2014-08-01 LAB — PREGNANCY, URINE: Preg Test, Ur: NEGATIVE

## 2014-08-01 MED ORDER — METHOCARBAMOL 500 MG PO TABS: 500.0000 mg | ORAL_TABLET | Freq: Two times a day (BID) | ORAL | Status: DC

## 2014-08-01 MED ORDER — TRAMADOL HCL 50 MG PO TABS: 50.0000 mg | ORAL_TABLET | Freq: Four times a day (QID) | ORAL | Status: DC | PRN
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
## 2014-08-01 NOTE — Discharge Instructions (Signed)
Take Tramadol as needed for pain. Take Robaxin as needed for muscle spasm. You may take these medications together. Refer to attached documents for more information.  °
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
## 2014-08-02 NOTE — ED Provider Notes (Signed)
Medical screening examination/treatment/procedure(s) were performed by non-physician practitioner and as supervising physician I was immediately available for consultation/collaboration.   EKG Interpretation None        Gwyneth SproutWhitney Jaydyn Menon, MD 08/02/14 2342

## 2015-03-22 ENCOUNTER — Encounter (HOSPITAL_COMMUNITY): Payer: Self-pay | Admitting: *Deleted

## 2015-03-22 ENCOUNTER — Emergency Department (INDEPENDENT_AMBULATORY_CARE_PROVIDER_SITE_OTHER)
Admission: EM | Admit: 2015-03-22 | Discharge: 2015-03-22 | Disposition: A | Discharge status: Home / Self Care | Payer: Self-pay | Source: Home / Self Care | Attending: Emergency Medicine | Admitting: Emergency Medicine

## 2015-03-22 DIAGNOSIS — R141 Gas pain: Secondary | ICD-10-CM

## 2015-03-22 DIAGNOSIS — R109 Unspecified abdominal pain: Secondary | ICD-10-CM

## 2015-03-22 DIAGNOSIS — G44219 Episodic tension-type headache, not intractable: Secondary | ICD-10-CM

## 2015-03-22 LAB — POCT URINALYSIS DIP (DEVICE)
BILIRUBIN URINE: NEGATIVE
Glucose, UA: NEGATIVE mg/dL
HGB URINE DIPSTICK: NEGATIVE
Leukocytes, UA: NEGATIVE
Nitrite: NEGATIVE
Protein, ur: NEGATIVE mg/dL
Specific Gravity, Urine: >=1.03 (ref 1.005–1.030)
UROBILINOGEN UA: 0.2 mg/dL (ref 0.0–1.0)
pH: 6 (ref 5.0–8.0)

## 2015-03-22 LAB — POCT PREGNANCY, URINE: PREG TEST UR: NEGATIVE

## 2015-03-22 NOTE — Discharge Instructions (Signed)
Bloating Bloating is the feeling of fullness in your belly. You may feel as though your pants are too tight. Often the cause of bloating is overeating, retaining fluids, or having gas in your bowel. It is also caused by swallowing air and eating foods that cause gas. Irritable bowel syndrome is one of the most common causes of bloating. Constipation is also a common cause. Sometimes more serious problems can cause bloating. SYMPTOMS  Usually there is a feeling of fullness, as though your abdomen is bulged out. There may be mild discomfort.  DIAGNOSIS  Usually no particular testing is necessary for most bloating. If the condition persists and seems to become worse, your caregiver may do additional testing.  TREATMENT   There is no direct treatment for bloating.  Do not put gas into the bowel. Avoid chewing gum and sucking on candy. These tend to make you swallow air. Swallowing air can also be a nervous habit. Try to avoid this.  Avoiding high residue diets will help. Eat foods with soluble fibers (examples include root vegetables, apples, or barley) and substitute dairy products with soy and rice products. This helps irritable bowel syndrome.  If constipation is the cause, then a high residue diet with more fiber will help.  Avoid carbonated beverages.  Over-the-counter preparations are available that help reduce gas. Your pharmacist can help you with this. SE Abdominal Pain, Women Abdominal (stomach, pelvic, or belly) pain can be caused by many things. It is important to tell your doctor:  The location of the pain.  Does it come and go or is it present all the time?  Are there things that start the pain (eating certain foods, exercise)?  Are there other symptoms associated with the pain (fever, nausea, vomiting, diarrhea)? All of this is helpful to know when trying to find the cause of the pain. CAUSES   Stomach: virus or bacteria infection, or ulcer.  Intestine: appendicitis  (inflamed appendix), regional ileitis (Crohn's disease), ulcerative colitis (inflamed colon), irritable bowel syndrome, diverticulitis (inflamed diverticulum of the colon), or cancer of the stomach or intestine.  Gallbladder disease or stones in the gallbladder.  Kidney disease, kidney stones, or infection.  Pancreas infection or cancer.  Fibromyalgia (pain disorder).  Diseases of the female organs:  Uterus: fibroid (non-cancerous) tumors or infection.  Fallopian tubes: infection or tubal pregnancy.  Ovary: cysts or tumors.  Pelvic adhesions (scar tissue).  Endometriosis (uterus lining tissue growing in the pelvis and on the pelvic organs).  Pelvic congestion syndrome (female organs filling up with blood just before the menstrual period).  Pain with the menstrual period.  Pain with ovulation (producing an egg).  Pain with an IUD (intrauterine device, birth control) in the uterus.  Cancer of the female organs.  Functional pain (pain not caused by a disease, may improve without treatment).  Psychological pain.  Depression. DIAGNOSIS  Your doctor will decide the seriousness of your pain by doing an examination.  Blood tests.  X-rays.  Ultrasound.  CT scan (computed tomography, special type of X-ray).  MRI (magnetic resonance imaging).  Cultures, for infection.  Barium enema (dye inserted in the large intestine, to better view it with X-rays).  Colonoscopy (looking in intestine with a lighted tube).  Laparoscopy (minor surgery, looking in abdomen with a lighted tube).  Major abdominal exploratory surgery (looking in abdomen with a large incision). TREATMENT  The treatment will depend on the cause of the pain.   Many cases can be observed and treated at home.  Over-the-counter medicines recommended by your caregiver.  Prescription medicine.  Antibiotics, for infection.  Birth control pills, for painful periods or for ovulation pain.  Hormone  treatment, for endometriosis.  Nerve blocking injections.  Physical therapy.  Antidepressants.  Counseling with a psychologist or psychiatrist.  Minor or major surgery. HOME CARE INSTRUCTIONS   Do not take laxatives, unless directed by your caregiver.  Take over-the-counter pain medicine only if ordered by your caregiver. Do not take aspirin because it can cause an upset stomach or bleeding.  Try a clear liquid diet (broth or water) as ordered by your caregiver. Slowly move to a bland diet, as tolerated, if the pain is related to the stomach or intestine.  Have a thermometer and take your temperature several times a day, and record it.  Bed rest and sleep, if it helps the pain.  Avoid sexual intercourse, if it causes pain.  Avoid stressful situations.  Keep your follow-up appointments and tests, as your caregiver orders.  If the pain does not go away with medicine or surgery, you may try:  Acupuncture.  Relaxation exercises (yoga, meditation).  Group therapy.  Counseling. SEEK MEDICAL CARE IF:   You notice certain foods cause stomach pain.  Your home care treatment is not helping your pain.  You need stronger pain medicine.  You want your IUD removed.  You feel faint or lightheaded.  You develop nausea and vomiting.  You develop a rash.  You are having side effects or an allergy to your medicine. SEEK IMMEDIATE MEDICAL CARE IF:   Your pain does not go away or gets worse.  You have a fever.  Your pain is felt only in portions of the abdomen. The right side could possibly be appendicitis. The left lower portion of the abdomen could be colitis or diverticulitis.  You are passing blood in your stools (bright red or black tarry stools, with or without vomiting).  You have blood in your urine.  You develop chills, with or without a fever.  You pass out. MAKE SURE YOU:   Understand these instructions.  Will watch your condition.  Will get help  right away if you are not doing well or get worse. Document Released: 07/26/2007 Document Revised: 02/12/2014 Document Reviewed: 08/15/2009 [facility] Patient Information 2015 [place], [place]. This information is not intended to replace advice given to you by your health care provider. Make sure you discuss any questions you have with your health care provider.  Tension Headache Ibuprofen or tylenol as needed. Stay in cool environment A tension headache is pain, pressure, or aching felt over the front and sides of the head. Tension headaches often come after stress, feeling worried (anxiety), or feeling sad or down for a while (depressed). HOME CARE  Only take medicine as told by your doctor.  Lie down in a dark, quiet room when you have a headache.  Keep a journal to find out if certain things bring on headaches. For example, write down:  What you eat and drink.  How much sleep you get.  Any change to your diet or medicines.  Relax by getting a massage or doing other relaxing activities.  Put ice or heat packs on the head and neck area as told by your doctor.  Lessen stress.  Sit up straight. Do not tighten (tense) your muscles.  Quit smoking if you smoke.  Lessen how much alcohol you drink.  Lessen how much caffeine you drink, or stop drinking caffeine.  Eat and exercise regularly.  Get enough sleep.  Avoid using too much pain medicine. GET HELP RIGHT AWAY IF:   Your headache becomes really bad.  You have a fever.  You have a stiff neck.  You have trouble seeing.  Your muscles are weak, or you lose muscle control.  You lose your balance or have trouble walking.  You feel like you will pass out (faint), or you pass out.  You have really bad symptoms that are different than your first symptoms.  You have problems with the medicines given to you by your doctor.  Your medicines do not work.  Your headache feels different than the other headaches.  You feel  sick to your stomach (nauseous) or throw up (vomit). MAKE SURE YOU:   Understand these instructions.  Will watch your condition.  Will get help right away if you are not doing well or get worse. Document Released: 12/23/2009 Document Revised: 12/21/2011 Document Reviewed: 09/18/2011 [facility] Patient Information 2015 [place], [place]. This information is not intended to replace advice given to you by your health care provider. Make sure you discuss any questions you have with your health care provider. EK MEDICAL CARE IF:   Bloating continues and seems to be getting worse.  You notice a weight gain.  You have a weight loss but the bloating is getting worse.  You have changes in your bowel habits or develop nausea or vomiting. SEEK IMMEDIATE MEDICAL CARE IF:   You develop shortness of breath or swelling in your legs.  You have an increase in abdominal pain or develop chest pain. Document Released: 07/29/2006 Document Revised: 12/21/2011 Document Reviewed: 09/16/2007 [facility] Patient Information 2015 [place], [place]. This information is not intended to replace advice given to you by your health care provider. Make sure you discuss any questions you have with your health care provider.

## 2015-03-22 NOTE — ED Provider Notes (Signed)
CSN: [id number]     Arrival date & time 03/22/15  1727 History   First MD Initiated Contact with Patient 03/22/15 1844     Chief Complaint  Patient presents with  . Headache   (Consider location/radiation/quality/duration/timing/severity/associated sxs/prior Treatment) HPI Comments: 24 year old female complaining of a headache for 4 days. It is often all/comes and goes. The headache is located in the glabella and mid forehead as well as the occiput and posterior neck. The pain in the back of her head is worse when flexing her neck forward and sometimes moving left or right. She is feeling a little weaker than usual. She is having cramps across lower abdomen intermittently. She is having some back pain.   Past Medical History  Diagnosis Date  . Medical history non-contributory    History reviewed. No pertinent past surgical history. History reviewed. No pertinent family history. History  Substance Use Topics  . Smoking status: Never Smoker   . Smokeless tobacco: Not on file  . Alcohol Use: No   OB History    No data available     Review of Systems  Constitutional: Negative for fever, activity change and fatigue.  HENT: Positive for sore throat. Negative for congestion, ear pain, postnasal drip and trouble swallowing.   Eyes: Negative.   Respiratory: Negative for cough and shortness of breath.   Cardiovascular: Negative for chest pain and palpitations.  Gastrointestinal: Negative for nausea, vomiting, diarrhea, constipation and abdominal distention.  Genitourinary: Negative for dysuria, frequency, hematuria, vaginal pain and pelvic pain.  Musculoskeletal: Negative.   Neurological: Negative.     Allergies  Review of patient's allergies indicates no known allergies.  Home Medications   Prior to Admission medications   Medication Sig Start Date End Date Taking? Authorizing Provider  ibuprofen (ADVIL,MOTRIN) 200 MG tablet Take 800 mg by mouth every 6 (six) hours as needed for  mild pain.    Historical Provider, MD  methocarbamol (ROBAXIN) 500 MG tablet Take 1 tablet (500 mg total) by mouth 2 (two) times daily. 08/01/14   Kaitlyn Szekalski, PA-C  traMADol (ULTRAM) 50 MG tablet Take 1 tablet (50 mg total) by mouth every 6 (six) hours as needed. 08/01/14   Kaitlyn Szekalski, PA-C   BP 113/71 mmHg  Pulse 102  Temp(Src) 99.8 F (37.7 C) (Oral)  Resp 16  SpO2 100% Physical Exam  Constitutional: She is oriented to person, place, and time. She appears well-developed and well-nourished. No distress.  HENT:  Mouth/Throat: Oropharynx is clear and moist. No oropharyngeal exudate.  Uvula midline. Tongue midline. No intraoral swelling. Oropharynx is moist  Palpation of the posterior scalp and posterior neck musculature reveals tenderness. This the area and which the patient is complaining of pain and states palpation increases the posterior headache pain. No tenderness to the 4 head or glabella.  Eyes: Conjunctivae and EOM are normal.  Neck: Normal range of motion. Neck supple.  Cardiovascular: Normal rate, regular rhythm and normal heart sounds.   Pulmonary/Chest: Effort normal. No respiratory distress. She has no wheezes. She has no rales.  Abdominal: Soft. Bowel sounds are normal. She exhibits no distension and no mass. There is no rebound and no guarding.  Mild tenderness across lower abdomen. Percussion reveals temperature knee and most quadrants.  Musculoskeletal: She exhibits no edema.  Lymphadenopathy:    She has no cervical adenopathy.  Neurological: She is alert and oriented to person, place, and time.  Skin: Skin is warm and dry.  Nursing note and vitals reviewed.  ED Course  Procedures (including critical care time) Labs Review Labs Reviewed  POCT URINALYSIS DIP (DEVICE) - Abnormal; Notable for the following:    Ketones, ur TRACE (*)    All other components within normal limits  POCT PREGNANCY, URINE   Results for orders placed or performed during  the hospital encounter of 03/22/15  POCT urinalysis dip (device)  Result Value Ref Range   Glucose, UA NEGATIVE NEGATIVE mg/dL   Bilirubin Urine NEGATIVE NEGATIVE   Ketones, ur TRACE (A) NEGATIVE mg/dL   Specific Gravity, Urine >=1.030 1.005 - 1.030   Hgb urine dipstick NEGATIVE NEGATIVE   pH 6.0 5.0 - 8.0   Protein, ur NEGATIVE NEGATIVE mg/dL   Urobilinogen, UA 0.2 0.0 - 1.0 mg/dL   Nitrite NEGATIVE NEGATIVE   Leukocytes, UA NEGATIVE NEGATIVE  Pregnancy, urine POC  Result Value Ref Range   Preg Test, Ur NEGATIVE NEGATIVE     Imaging Review No results found.   MDM   1. Episodic tension-type headache, not intractable   2. Abdominal cramps   3. Abdominal gas pain    Patient appears generally well. Speech and movement energetic. She is smiling and occasionally jovial. Her job requires her to be outside in the heat for an hour and a half every day. She also works in Audiological scientist. Suspect that she has tension-type headache. May take ibuprofen or Tylenol for that. Discomfort across lower abdomen suspected to be abdominal gas pain or even constipation. Drink plenty of fluids stay well-hydrated.    Hayden Rasmussen, NP 03/22/15 [phone]

## 2015-03-22 NOTE — ED Notes (Signed)
Pt  Reports  Symptoms     Of  Headache          And  Neck     Pain         With       Symptoms        X  4  Days

## 2015-04-10 ENCOUNTER — Encounter (HOSPITAL_COMMUNITY): Payer: Self-pay | Admitting: Emergency Medicine

## 2015-04-10 ENCOUNTER — Emergency Department (HOSPITAL_COMMUNITY)
Admission: EM | Admit: 2015-04-10 | Discharge: 2015-04-10 | Disposition: A | Discharge status: Home / Self Care | Payer: Self-pay | Attending: Emergency Medicine | Admitting: Emergency Medicine

## 2015-04-10 DIAGNOSIS — R42 Dizziness and giddiness: Secondary | ICD-10-CM | POA: Insufficient documentation

## 2015-04-10 DIAGNOSIS — Z793 Long term (current) use of hormonal contraceptives: Secondary | ICD-10-CM | POA: Insufficient documentation

## 2015-04-10 DIAGNOSIS — R55 Syncope and collapse: Secondary | ICD-10-CM

## 2015-04-10 DIAGNOSIS — E860 Dehydration: Secondary | ICD-10-CM

## 2015-04-10 LAB — I-STAT BETA HCG BLOOD, ED (MC, WL, AP ONLY): I-stat hCG, quantitative: <5 m[IU]/mL (ref <5)

## 2015-04-10 MED ORDER — SODIUM CHLORIDE 0.9 % IV BOLUS (SEPSIS)
1000.0000 mL | Freq: Once | INTRAVENOUS | Status: AC
  Administered 2015-04-10: 1000 mL via INTRAVENOUS

## 2015-04-10 NOTE — ED Notes (Signed)
Per EMS: pt was walking and has syncopal episodes and hit head on fence, now c/o headache. Vitals sign WDL, 20 g in left AC.

## 2015-04-10 NOTE — ED Provider Notes (Signed)
CSN: [id number]     Arrival date & time 04/10/15  1659 History   First MD Initiated Contact with Patient 04/10/15 1701     Chief Complaint  Patient presents with  . Loss of Consciousness     (Consider location/radiation/quality/duration/timing/severity/associated sxs/prior Treatment) HPI Brenda Simon is a 24 y.o. female who comes in for evaluation of syncopal event. Patient [place], stood up very quickly and started to run to get her water bottle in her car when she became lightheaded and fell forward. She reports passing out at that time, she woke and ran back into the water park at which point she reports becoming short of breath and fell forward hitting her head on a fence. She denies any prodromal events. Denies any chest pain, nausea or vomiting, numbness or weakness, abdominal pain. She admits to not drinking any water throughout the day. She does report mild frontal headache secondary to hitting her head on the fence. Patient reports she is on Depo.   Past Medical History  Diagnosis Date  . Medical history non-contributory    History reviewed. No pertinent past surgical history. History reviewed. No pertinent family history. History  Substance Use Topics  . Smoking status: Never Smoker   . Smokeless tobacco: Not on file  . Alcohol Use: No   OB History    No data available     Review of Systems  A 10 point review of systems was completed and was negative except for pertinent positives and negatives as mentioned in the history of present illness    Allergies  Review of patient's allergies indicates no known allergies.  Home Medications   Prior to Admission medications   Medication Sig Start Date End Date Taking? Authorizing Provider  medroxyPROGESTERone (DEPO-PROVERA) 150 MG/ML injection Inject 150 mg into the muscle every 3 (three) months.   Yes Historical Provider, MD  methocarbamol (ROBAXIN) 500 MG tablet Take 1 tablet (500 mg  total) by mouth 2 (two) times daily. Patient not taking: Reported on 04/10/2015 08/01/14   Emilia BeckKaitlyn Szekalski, PA-C  traMADol (ULTRAM) 50 MG tablet Take 1 tablet (50 mg total) by mouth every 6 (six) hours as needed. Patient not taking: Reported on 04/10/2015 08/01/14   Kaitlyn Szekalski, PA-C   BP 103/57 mmHg  Pulse 85  Temp(Src) 98.2 F (36.8 C) (Oral)  Resp 23  SpO2 100% Physical Exam  Constitutional: She is oriented to person, place, and time. She appears well-developed and well-nourished.  HENT:  Head: Normocephalic and atraumatic.  Mouth/Throat: Oropharynx is clear and moist.  Eyes: Conjunctivae are normal. Pupils are equal, round, and reactive to light. Right eye exhibits no discharge. Left eye exhibits no discharge. No scleral icterus.  Neck: Neck supple.  Cardiovascular: Normal rate, regular rhythm and normal heart sounds.   Pulmonary/Chest: Effort normal and breath sounds normal. No respiratory distress. She has no wheezes. She has no rales.  Abdominal: Soft. There is no tenderness.  Musculoskeletal: She exhibits no tenderness.  Neurological: She is alert and oriented to person, place, and time.  Cranial Nerves II-XII grossly intact. Motor and sensation 5/5 in all 4 extremities. Extraocular movements intact without nystagmus Completes finger to nose coordination movements without difficulty. Gait is baseline.  Skin: Skin is warm and dry. No rash noted.  Psychiatric: She has a normal mood and affect.  Nursing note and vitals reviewed.   ED Course  Procedures (including critical care time) Labs Review Labs Reviewed  I-STAT BETA  HCG BLOOD, ED ([facility], [facility], [facility] ONLY)  POC URINE PREG, ED    Imaging Review No results found.   EKG Interpretation None      ED ECG REPORT   Date: 04/11/2015  Rate: 71  Rhythm: normal sinus rhythm  QRS Axis: normal  Intervals: normal  ST/T Wave abnormalities: normal  Conduction Disutrbances:none  Narrative Interpretation:   Old EKG Reviewed:  unchanged  I have personally reviewed the EKG tracing and agree with the computerized printout as noted.  Meds given in ED:  Medications  sodium chloride 0.9 % bolus 1,000 mL (0 mLs Intravenous Stopped 04/10/15 1904)  sodium chloride 0.9 % bolus 1,000 mL (1,000 mLs Intravenous New Bag/Given 04/10/15 2024)    New Prescriptions   No medications on file   Filed Vitals:   04/10/15 1717 04/10/15 1937  BP: 101/63 103/57  Pulse: 72 85  Temp: 98.2 F (36.8 C)   TempSrc: Oral   Resp: 20 23  SpO2: 100% 100%    MDM  Patient is orthostatic. Will rehydrate with IV fluids and reassess. -afebrile Pt resting comfortably in ED. PE--normal cardiac, pulmonary and neuro exams. Labwork: Negative pregnancy.  DDX--patient's syncopal event likely secondary to postural orthostatic hypotension. Also suspect dehydration. Patient given fluid boluses in the ED, reassessed and appears to be at her baseline. States she feels well and is ready to go home. Clinical picture not consistent with other acute or emergent pathology.  I discussed all relevant lab findings and imaging results with pt and they verbalized understanding. Discussed f/u with PCP within 48 hrs and return precautions, pt very amenable to plan.  Final diagnoses:  Dehydration  Syncope, unspecified syncope type       Joycie Peek, PA-C 04/11/15 2130  Elwin Mocha, MD 04/11/15 1500

## 2015-04-10 NOTE — ED Notes (Signed)
Pt does not need to use restroom at this time

## 2015-04-10 NOTE — Discharge Instructions (Signed)
You were evaluated in the ED today after passing out. There does not appear to be an emergent cause her symptoms at this time. Your labs and exam were very reassuring. He likely became dehydrated and this resulted in you fainting. It is important to stay well-hydrated especially in hot climates. Follow-up with primary care for further evaluation and management of her symptoms. Return to ED for worsening symptoms.  Dehydration, Adult Dehydration is when you lose more fluids from the body than you take in. Vital organs like the kidneys, brain, and heart cannot function without a proper amount of fluids and salt. Any loss of fluids from the body can cause dehydration.  CAUSES   Vomiting.  Diarrhea.  Excessive sweating.  Excessive urine output.  Fever. SYMPTOMS  Mild dehydration  Thirst.  Dry lips.  Slightly dry mouth. Moderate dehydration  Very dry mouth.  Sunken eyes.  Skin does not bounce back quickly when lightly pinched and released.  Dark urine and decreased urine production.  Decreased tear production.  Headache. Severe dehydration  Very dry mouth.  Extreme thirst.  Rapid, weak pulse (more than 100 beats per minute at rest).  Cold hands and feet.  Not able to sweat in spite of heat and temperature.  Rapid breathing.  Blue lips.  Confusion and lethargy.  Difficulty being awakened.  Minimal urine production.  No tears. DIAGNOSIS  Your caregiver will diagnose dehydration based on your symptoms and your exam. Blood and urine tests will help confirm the diagnosis. The diagnostic evaluation should also identify the cause of dehydration. TREATMENT  Treatment of mild or moderate dehydration can often be done at home by increasing the amount of fluids that you drink. It is best to drink small amounts of fluid more often. Drinking too much at one time can make vomiting worse. Refer to the home care instructions below. Severe dehydration needs to be treated at  the hospital where you will probably be given intravenous (IV) fluids that contain water and electrolytes. HOME CARE INSTRUCTIONS   Ask your caregiver about specific rehydration instructions.  Drink enough fluids to keep your urine clear or pale yellow.  Drink small amounts frequently if you have nausea and vomiting.  Eat as you normally do.  Avoid:  Foods or drinks high in sugar.  Carbonated drinks.  Juice.  Extremely hot or cold fluids.  Drinks with caffeine.  Fatty, greasy foods.  Alcohol.  Tobacco.  Overeating.  Gelatin desserts.  Wash your hands well to avoid spreading bacteria and viruses.  Only take over-the-counter or prescription medicines for pain, discomfort, or fever as directed by your caregiver.  Ask your caregiver if you should continue all prescribed and over-the-counter medicines.  Keep all follow-up appointments with your caregiver. SEEK MEDICAL CARE IF:  You have abdominal pain and it increases or stays in one area (localizes).  You have a rash, stiff neck, or severe headache.  You are irritable, sleepy, or difficult to awaken.  You are weak, dizzy, or extremely thirsty. SEEK IMMEDIATE MEDICAL CARE IF:   You are unable to keep fluids down or you get worse despite treatment.  You have frequent episodes of vomiting or diarrhea.  You have blood or green matter (bile) in your vomit.  You have blood in your stool or your stool looks black and tarry.  You have not urinated in 6 to 8 hours, or you have only urinated a small amount of very dark urine.  You have a fever.  You faint. MAKE  SURE YOU:   Understand these instructions.  Will watch your condition.  Will get help right away if you are not doing well or get worse. Document Released: 09/28/2005 Document Revised: 12/21/2011 Document Reviewed: 05/18/2011 [facility] Patient Information 2015 [place], [place]. This information is not intended to replace advice given to you by your  health care provider. Make sure you discuss any questions you have with your health care provider.

## 2015-04-10 NOTE — ED Notes (Signed)
Bed: [id number] Expected date:  Expected time:  Means of arrival:  Comments: EMS- 20s, syncope, hypotension, IV established

## 2016-04-13 ENCOUNTER — Encounter (HOSPITAL_COMMUNITY): Payer: Self-pay | Admitting: Emergency Medicine

## 2016-04-13 ENCOUNTER — Ambulatory Visit (HOSPITAL_COMMUNITY)
Admission: EM | Admit: 2016-04-13 | Discharge: 2016-04-13 | Disposition: A | Discharge status: Home / Self Care | Payer: Self-pay | Attending: Family Medicine | Admitting: Family Medicine

## 2016-04-13 DIAGNOSIS — R102 Pelvic and perineal pain unspecified side: Secondary | ICD-10-CM

## 2016-04-13 DIAGNOSIS — N739 Female pelvic inflammatory disease, unspecified: Secondary | ICD-10-CM | POA: Insufficient documentation

## 2016-04-13 DIAGNOSIS — D573 Sickle-cell trait: Secondary | ICD-10-CM | POA: Insufficient documentation

## 2016-04-13 DIAGNOSIS — Z8249 Family history of ischemic heart disease and other diseases of the circulatory system: Secondary | ICD-10-CM | POA: Insufficient documentation

## 2016-04-13 DIAGNOSIS — N949 Unspecified condition associated with female genital organs and menstrual cycle: Secondary | ICD-10-CM

## 2016-04-13 DIAGNOSIS — N730 Acute parametritis and pelvic cellulitis: Secondary | ICD-10-CM

## 2016-04-13 DIAGNOSIS — N898 Other specified noninflammatory disorders of vagina: Secondary | ICD-10-CM

## 2016-04-13 HISTORY — DX: Sickle-cell trait: D57.3

## 2016-04-13 LAB — POCT URINALYSIS DIP (DEVICE)
BILIRUBIN URINE: NEGATIVE
GLUCOSE, UA: NEGATIVE mg/dL
HGB URINE DIPSTICK: NEGATIVE
KETONES UR: NEGATIVE mg/dL
Leukocytes, UA: NEGATIVE
Nitrite: NEGATIVE
Protein, ur: NEGATIVE mg/dL
Specific Gravity, Urine: 1.02 (ref 1.005–1.030)
Urobilinogen, UA: 0.2 mg/dL (ref 0.0–1.0)
pH: 5.5 (ref 5.0–8.0)

## 2016-04-13 LAB — POCT PREGNANCY, URINE: Preg Test, Ur: NEGATIVE

## 2016-04-13 MED ORDER — CEFTRIAXONE SODIUM 250 MG IJ SOLR
INTRAMUSCULAR | Status: AC
  Filled 2016-04-13: qty 250

## 2016-04-13 MED ORDER — AZITHROMYCIN 250 MG PO TABS
1000.0000 mg | ORAL_TABLET | Freq: Once | ORAL | Status: AC
  Administered 2016-04-13: 1000 mg via ORAL

## 2016-04-13 MED ORDER — AZITHROMYCIN 250 MG PO TABS
ORAL_TABLET | ORAL | Status: AC
  Filled 2016-04-13: qty 4

## 2016-04-13 MED ORDER — METRONIDAZOLE 500 MG PO TABS: 500.0000 mg | ORAL_TABLET | Freq: Two times a day (BID) | ORAL | Status: DC

## 2016-04-13 MED ORDER — CEFTRIAXONE SODIUM 250 MG IJ SOLR
250.0000 mg | Freq: Once | INTRAMUSCULAR | Status: AC
  Administered 2016-04-13: 250 mg via INTRAMUSCULAR

## 2016-04-13 MED ORDER — LIDOCAINE HCL (PF) 1 % IJ SOLN
INTRAMUSCULAR | Status: AC
  Filled 2016-04-13: qty 2

## 2016-04-13 NOTE — Discharge Instructions (Signed)
Pelvic Inflammatory Disease °Pelvic inflammatory disease (PID) refers to an infection in some or all of the female organs. The infection can be in the uterus, ovaries, fallopian tubes, or the surrounding tissues in the pelvis. PID can cause abdominal or pelvic pain that comes on suddenly (acute pelvic pain). PID is a serious infection because it can lead to lasting (chronic) pelvic pain or the inability to have children (infertility). °CAUSES °This condition is most often caused by an infection that is spread during sexual contact. However, the infection can also be caused by the normal bacteria that are found in the vaginal tissues if these bacteria travel upward into the reproductive organs. PID can also occur following: °· The birth of a baby. °· A miscarriage. °· An abortion. °· Major pelvic surgery. °· The use of an intrauterine device (IUD). °· A sexual assault. °RISK FACTORS °This condition is more likely to develop in women who: °· Are younger than 23 years of age. °· Are sexually active at a young age. °· Use nonbarrier contraception. °· Have multiple sexual partners. °· Have sex with someone who has symptoms of an STD (sexually transmitted disease). °· Use oral contraception. °At times, certain behaviors can also increase the possibility of getting PID, such as: °· Using a vaginal douche. °· Having an IUD in place. °SYMPTOMS °Symptoms of this condition include: °· Abdominal or pelvic pain. °· Fever. °· Chills. °· Abnormal vaginal discharge. °· Abnormal uterine bleeding. °· Unusual pain shortly after the end of a menstrual period. °· Painful urination. °· Pain with sexual intercourse. °· Nausea and vomiting. °DIAGNOSIS °To diagnose this condition, your health care provider will do a physical exam and take your medical history. A pelvic exam typically reveals great tenderness in the uterus and the surrounding pelvic tissues. You may also have tests, such as: °· Lab tests, including a pregnancy test, blood  tests, and urine test. °· Culture tests of the vagina and cervix to check for an STD. °· Ultrasound. °· A laparoscopic procedure to look inside the pelvis. °· Examining vaginal secretions under a microscope. °TREATMENT °Treatment for this condition may involve one or more approaches. °· Antibiotic medicines may be prescribed to be taken by mouth. °· Sexual partners may need to be treated if the infection is caused by an STD. °· For more severe cases, hospitalization may be needed to give antibiotics directly into a vein through an IV tube. °· Surgery may be needed if other treatments do not help, but this is rare. °It may take weeks until you are completely well. If you are diagnosed with PID, you should also be checked for human immunodeficiency virus (HIV). Your health care provider may test you for infection again 3 months after treatment. You should not have unprotected sex. °HOME CARE INSTRUCTIONS °· Take over-the-counter and prescription medicines only as told by your health care provider. °· If you were prescribed an antibiotic medicine, take it as told by your health care provider. Do not stop taking the antibiotic even if you start to feel better. °· Do not have sexual intercourse until treatment is completed or as told by your health care provider. If PID is confirmed, your recent sexual partners will need treatment, especially if you had unprotected sex. °· Keep all follow-up visits as told by your health care provider. This is important. °SEEK MEDICAL CARE IF: °· You have increased or abnormal vaginal discharge. °· Your pain does not improve. °· You vomit. °· You have a fever. °· You   cannot tolerate your medicines.  Your partner has an STD.  You have pain when you urinate. SEEK IMMEDIATE MEDICAL CARE IF:  You have increased abdominal or pelvic pain.  You have chills.  Your symptoms are not better in 72 hours even with treatment.   This information is not intended to replace advice given to  you by your health care provider. Make sure you discuss any questions you have with your health care provider.   Document Released: 09/28/2005 Document Revised: 06/19/2015 Document Reviewed: 11/05/2014 Elsevier Interactive Patient Education 2016 Elsevier Inc.  Pelvic Pain, Female Pelvic pain is pain felt below the belly button and between your hips. It can be caused by many different things. It is important to get help right away. This is especially true for severe, sharp, or unusual pain that comes on suddenly.  HOME CARE  Only take medicine as told by your doctor.  Rest as told by your doctor.  Eat a healthy diet, such as fruits, vegetables, and lean meats.  Drink enough fluids to keep your pee (urine) clear or pale yellow, or as told.  Avoid sex (intercourse) if it causes pain.  Apply warm or cold packs to your lower belly (abdomen). Use the type of pack that helps the pain.  Avoid situations that cause you stress.  Keep a journal to track your pain. Write down:  When the pain started.  Where it is located.  If there are things that seem to be related to the pain, such as food or your period.  Follow up with your doctor as told. GET HELP RIGHT AWAY IF:   You have heavy bleeding from the vagina.  You have more pelvic pain.  You feel lightheaded or pass out (faint).  You have chills.  You have pain when you pee or have blood in your pee.  You cannot stop having watery poop (diarrhea).  You cannot stop throwing up (vomiting).  You have a fever or lasting symptoms for more than 3 days.  You have a fever and your symptoms suddenly get worse.  You are being physically or sexually abused.  Your medicine does not help your pain.  You have fluid (discharge) coming from your vagina that is not normal. MAKE SURE YOU:  Understand these instructions.  Will watch your condition.  Will get help if you are not doing well or get worse.   This information is not  intended to replace advice given to you by your health care provider. Make sure you discuss any questions you have with your health care provider.   Document Released: 03/16/2008 Document Revised: 10/19/2014 Document Reviewed: 01/18/2012 Elsevier Interactive Patient Education [employer] Elsevier Inc.

## 2016-04-13 NOTE — ED Notes (Addendum)
Patient reports abdominal cramping and low back pain for a month.  Thought to be associated with menstual cycle.  Cycle came and went and pain continued.  Patient reports intermittent burning with urination and urine is dark.  Denies vaginal discharge.  No sexual activity since march 2017.  Denies nausea, vomiting or diarrhea

## 2016-04-13 NOTE — ED Provider Notes (Signed)
CSN: [id number]     Arrival date & time 04/13/16  1002 History   First MD Initiated Contact with Patient 04/13/16 1136     Chief Complaint  Patient presents with  . Abdominal Pain   (Consider location/radiation/quality/duration/timing/severity/associated sxs/prior Treatment) HPI Comments: 25 year old female presents to the urgent care with complaints of pelvic cramping and pain across the lower most back for the past 3 days. She states she occasionally has pain after voiding but no dysuria or urinary frequency, also after voiding discomfort or cramping in the upper abdomen.   Past Medical History  Diagnosis Date  . Medical history non-contributory   . Sickle cell trait (HCC)    History reviewed. No pertinent past surgical history. Family History  Problem Relation Age of Onset  . Hypertension Mother    Social History  Substance Use Topics  . Smoking status: Never Smoker   . Smokeless tobacco: None  . Alcohol Use: No   OB History    No data available     Review of Systems  Constitutional: Negative.  Negative for fever.  HENT: Negative.   Respiratory: Negative.   Cardiovascular: Negative.   Genitourinary: Positive for pelvic pain. Negative for dysuria, frequency, flank pain, vaginal bleeding, vaginal pain and menstrual problem.  Musculoskeletal: Positive for back pain.  Neurological: Negative.   Psychiatric/Behavioral: Negative.     Allergies  Review of patient's allergies indicates no known allergies.  Home Medications   Prior to Admission medications   Medication Sig Start Date End Date Taking? Authorizing Provider  metroNIDAZOLE (FLAGYL) 500 MG tablet Take 1 tablet (500 mg total) by mouth 2 (two) times daily. X 7 days 04/13/16   Hayden Rasmussenavid Rayli Wiederhold, NP   Meds Ordered and Administered this Visit   Medications  cefTRIAXone (ROCEPHIN) injection 250 mg (not administered)  azithromycin (ZITHROMAX) tablet 1,000 mg (not administered)    BP 118/68 mmHg  Pulse 69  Temp(Src)  98.4 F (36.9 C) (Oral)  Resp 18  SpO2 100%  LMP 03/14/2016 No data found.   Physical Exam  Constitutional: She is oriented to person, place, and time. She appears well-developed and well-nourished. No distress.  Eyes: EOM are normal.  Neck: Normal range of motion. Neck supple.  Cardiovascular: Normal rate.   Pulmonary/Chest: Effort normal. No respiratory distress.  Abdominal:  Abdomen soft and nontender. No rebound or guarding. There is tenderness across the suprapubic, left and right and mid pelvis.  Genitourinary:  Normal external female genitalia. Trace amount of thin gray discharge from the vaginal orifice. Small amount of thin gray discharge coating the cervix and vaginal walls. The cervix and ectocervix are erythematous. No lesions. Os deeply erythematous. Equivocal CMT. Positive for bilateral adnexal tenderness.  Ashleigh, RN present  Musculoskeletal: She exhibits no edema or tenderness.  No lower back or spinal TTP or percussion tenderness.  Neurological: She is alert and oriented to person, place, and time. She exhibits normal muscle tone.  Skin: Skin is warm and dry.  Psychiatric: She has a normal mood and affect.  Nursing note and vitals reviewed.   ED Course  Procedures (including critical care time)  Labs Review Labs Reviewed  POCT URINALYSIS DIP (DEVICE)  CERVICOVAGINAL ANCILLARY ONLY   Results for orders placed or performed during the hospital encounter of 04/13/16  POCT urinalysis dip (device)  Result Value Ref Range   Glucose, UA NEGATIVE NEGATIVE mg/dL   Bilirubin Urine NEGATIVE NEGATIVE   Ketones, ur NEGATIVE NEGATIVE mg/dL   Specific Gravity, Urine 1.020 1.005 -  1.030   Hgb urine dipstick NEGATIVE NEGATIVE   pH 5.5 5.0 - 8.0   Protein, ur NEGATIVE NEGATIVE mg/dL   Urobilinogen, UA 0.2 0.0 - 1.0 mg/dL   Nitrite NEGATIVE NEGATIVE   Leukocytes, UA NEGATIVE NEGATIVE     Imaging Review No results found.   Visual Acuity Review  Right Eye  Distance:   Left Eye Distance:   Bilateral Distance:    Right Eye Near:   Left Eye Near:    Bilateral Near:         MDM   1. Pelvic pain in female   2. Vaginal discharge   3. Adnexal tenderness   4. PID (acute pelvic inflammatory disease)    The most likely diagnosis for her pelvic pain is due to BV Based on findings. Treat with Flagyl per Rx. Other medications administered as below. Instructions for pelvic pain, vaginitis are given.  LMP June 10 or 11th. Her menses are usually not right on schedule and very on dates. She states she has not missed any periods. Last sexual intercourse was in February 2017.  Meds ordered this encounter  Medications  . cefTRIAXone (ROCEPHIN) injection 250 mg    Sig:   . azithromycin (ZITHROMAX) tablet 1,000 mg    Sig:   . metroNIDAZOLE (FLAGYL) 500 MG tablet    Sig: Take 1 tablet (500 mg total) by mouth 2 (two) times daily. X 7 days    Dispense:  14 tablet    Refill:  0    Order Specific Question:  Supervising Provider    Answer:  Linna HoffKINDL, JAMES D [[id number]]     Hayden Rasmussenavid Mescal Flinchbaugh, NP 04/13/16 1311

## 2016-04-13 NOTE — ED Notes (Signed)
Verified phone number with patient

## 2016-04-15 LAB — CERVICOVAGINAL ANCILLARY ONLY
Chlamydia: NEGATIVE
Neisseria Gonorrhea: NEGATIVE
WET PREP (BD AFFIRM): POSITIVE — AB

## 2016-04-18 ENCOUNTER — Telehealth (HOSPITAL_COMMUNITY): Payer: Self-pay | Admitting: Emergency Medicine

## 2016-04-18 NOTE — ED Notes (Signed)
Called pt and notified of recent lab results from visit 7/3 Pt ID'd properly... Reports feeling better and sx have subsided Adv pt if sx are not getting better to return  Pt verb understanding Education on safe sex given  Per Dr. Dayton ScrapeMurray,   Notes Recorded by Eustace MooreLaura W Murray, MD on 04/18/2016 at 8:00 AM Please let patient know that tests for gonorrhea/chlamydia were negative; test for gardnerella (bacterial vaginosis) was positive. [facility] Recheck as needed. LM

## 2016-06-24 ENCOUNTER — Encounter (HOSPITAL_COMMUNITY): Payer: Self-pay | Admitting: Emergency Medicine

## 2016-06-24 ENCOUNTER — Ambulatory Visit (HOSPITAL_COMMUNITY)
Admission: EM | Admit: 2016-06-24 | Discharge: 2016-06-24 | Disposition: A | Discharge status: Home / Self Care | Payer: Self-pay | Attending: Family Medicine | Admitting: Family Medicine

## 2016-06-24 DIAGNOSIS — L0291 Cutaneous abscess, unspecified: Secondary | ICD-10-CM

## 2016-06-24 DIAGNOSIS — L0390 Cellulitis, unspecified: Secondary | ICD-10-CM

## 2016-06-24 MED ORDER — CLINDAMYCIN HCL 300 MG PO CAPS: 300.0000 mg | ORAL_CAPSULE | Freq: Four times a day (QID) | ORAL | 0 refills | Status: DC

## 2016-06-24 NOTE — ED Provider Notes (Signed)
[facility]    CSN: [id number] Arrival date & time: 06/24/16  1339  First Provider Contact:  First MD Initiated Contact with Patient 06/24/16 1514    History   Chief Complaint No chief complaint on file.   HPI LINET BRASH is a 25 y.o. female accompanied by her mother for an abscess on her left buttock.   She noticed mildly tender small bump on the inside of her left buttock about 1 week ago which has grown gradually larger and more painful to the point that she is unable to sit down without severe pain. She rates this as "10,000" on a scale from 1 - 10. It has not drained anything, she's taken OTC pain medications with little improvement. No fevers, chills. No history of abscesses or immunocompromise.   HPI  Past Medical History:  Diagnosis Date  . Medical history non-contributory   . Sickle cell trait [facility])     Patient Active Problem List   Diagnosis Date Noted  . Depression 12/10/2012    History reviewed. No pertinent surgical history.  OB History    No data available       Home Medications    Prior to Admission medications   Medication Sig Start Date End Date Taking? Authorizing Provider  clindamycin (CLEOCIN) 300 MG capsule Take 1 capsule (300 mg total) by mouth 4 (four) times daily. 06/24/16   Tyrone Nine, MD  metroNIDAZOLE (FLAGYL) 500 MG tablet Take 1 tablet (500 mg total) by mouth 2 (two) times daily. X 7 days 04/13/16   Hayden Rasmussen, NP    Family History Family History  Problem Relation Age of Onset  . Hypertension Mother     Social History Social History  Substance Use Topics  . Smoking status: Never Smoker  . Smokeless tobacco: Not on file  . Alcohol use No     Allergies   Review of patient's allergies indicates no known allergies.   Review of Systems Review of Systems Per HPI  Physical Exam Triage Vital Signs ED Triage Vitals [06/24/16 1434]  Enc Vitals Group     BP 125/64     Pulse Rate 90     Resp 16     Temp 98.4 F  (36.9 C)     Temp Source Oral     SpO2 100 %     Weight 128 lb (58.1 kg)     Height 5\' 4"  (1.626 m)     Head Circumference      Peak Flow      Pain Score      Pain Loc      Pain Edu?      Excl. in GC?    No data found.   Updated Vital Signs BP 125/64 (BP Location: Left Arm)   Pulse 90   Temp 98.4 F (36.9 C) (Oral)   Resp 16   Ht 5\' 4"  (1.626 m)   Wt 128 lb (58.1 kg)   SpO2 100%   BMI 21.97 kg/m   Physical Exam  Constitutional: She is oriented to person, place, and time. She appears well-developed and well-nourished. No distress.  Eyes: EOM are normal. Pupils are equal, round, and reactive to light. No scleral icterus.  Neck: Neck supple. No JVD present.  Cardiovascular: Normal rate, regular rhythm, normal heart sounds and intact distal pulses.   No murmur heard. Pulmonary/Chest: Effort normal and breath sounds normal. No respiratory distress.  Abdominal: Soft. Bowel sounds are normal. She exhibits no  distension. There is no tenderness.  Musculoskeletal: Normal range of motion. She exhibits no edema or tenderness.  Lymphadenopathy:    She has no cervical adenopathy.  Neurological: She is alert and oriented to person, place, and time. She exhibits normal muscle tone.  Skin:  ~4in x 3in hyperpigmented induration on the left buttock with no palpable fluctuance on brief exam. There was no purulence or bleeding. On separation of buttocks the patient ascended the exam table and became tearful terminating the exam.   Vitals reviewed.  UC Treatments / Results  Labs (all labs ordered are listed, but only abnormal results are displayed) Labs Reviewed - No data to display  EKG  EKG Interpretation None      Radiology No results found.  Procedures Procedures (including critical care time)  Medications Ordered in UC Medications - No data to display   Initial Impression / Assessment and Plan / UC Course  I have reviewed the triage vital signs and the nursing  notes.  Pertinent labs & imaging results that were available during my care of the patient were reviewed by me and considered in my medical decision making (see chart for details).  Final Clinical Impressions(s) / UC Diagnoses   Final diagnoses:  Abscess and cellulitis   Peggye Fothergillayosha N Burnett is a previously healthy 24yo female presenting with buttock cellulitis +/- abscess. Patient terminated exam due to tenderness with even separating buttocks and minimal palpation of induration. Plan was to anesthetize the area for exam but patient opts for empiric antibiotic therapy instead. She is afebrile, vital signs are within normal limits and she has no signs or symptoms of systemic infection or immune compromise. She will start abx and return in 5 days for evaluation for need for I&D at that time or sooner if symptoms do not improve.   New Prescriptions New Prescriptions   CLINDAMYCIN (CLEOCIN) 300 MG CAPSULE    Take 1 capsule (300 mg total) by mouth 4 (four) times daily.     Tyrone Nineyan B Briget Shaheed, MD 06/24/16 [phone]

## 2016-06-24 NOTE — Discharge Instructions (Signed)
You have evidence of a skin infection that requires treatment with antibiotics and preferably incision and drainage. It is reasonable, as long as you are not systemically ill, to try antibiotics first, but you must return to your primary doctor or this urgent care if symptoms get worse. Definitely follow up early next week to be reevaluated.

## 2016-06-24 NOTE — ED Triage Notes (Signed)
Reviewed instructions and script

## 2016-08-17 ENCOUNTER — Encounter (HOSPITAL_COMMUNITY): Payer: Self-pay | Admitting: Emergency Medicine

## 2016-08-17 ENCOUNTER — Ambulatory Visit (HOSPITAL_COMMUNITY)
Admission: EM | Admit: 2016-08-17 | Discharge: 2016-08-17 | Disposition: A | Discharge status: Home / Self Care | Payer: Self-pay | Attending: Family Medicine | Admitting: Family Medicine

## 2016-08-17 DIAGNOSIS — R112 Nausea with vomiting, unspecified: Secondary | ICD-10-CM

## 2016-08-17 DIAGNOSIS — R102 Pelvic and perineal pain: Secondary | ICD-10-CM

## 2016-08-17 LAB — POCT URINALYSIS DIP (DEVICE)
Bilirubin Urine: NEGATIVE
Glucose, UA: NEGATIVE mg/dL
HGB URINE DIPSTICK: NEGATIVE
Ketones, ur: NEGATIVE mg/dL
LEUKOCYTES UA: NEGATIVE
Nitrite: NEGATIVE
Protein, ur: 30 mg/dL — AB
Specific Gravity, Urine: >=1.03 (ref 1.005–1.030)
UROBILINOGEN UA: 0.2 mg/dL (ref 0.0–1.0)
pH: 5.5 (ref 5.0–8.0)

## 2016-08-17 LAB — POCT PREGNANCY, URINE: Preg Test, Ur: NEGATIVE

## 2016-08-17 MED ORDER — ONDANSETRON HCL 4 MG PO TABS: 4.0000 mg | ORAL_TABLET | Freq: Four times a day (QID) | ORAL | 0 refills | Status: DC

## 2016-08-17 MED ORDER — NAPROXEN 500 MG PO TABS: 500.0000 mg | ORAL_TABLET | Freq: Two times a day (BID) | ORAL | 0 refills | Status: DC

## 2016-08-17 NOTE — ED Provider Notes (Signed)
CSN: [id number]     Arrival date & time 08/17/16  1719 History   First MD Initiated Contact with Patient 08/17/16 1826     Chief Complaint  Patient presents with  . Abdominal Pain   (Consider location/radiation/quality/duration/timing/severity/associated sxs/prior Treatment) HPI  Brenda Simon is a 25 y.o. female presenting to UC with c/o intermittent lower abdominal pain that has been going on for about 1 month.  [facility] and tested for STIs but all tests came back negative.  Since then, her menstrual cycle started and she started to have abdominal pain again.  Pain is cramping and sharp, mild in severity. Associated nausea and vomiting.  She reports vomiting about 4 times today. Denies fever, chills or urinary symptoms.  Denies known hx of ovarian cysts or fibroids.     Past Medical History:  Diagnosis Date  . Medical history non-contributory   . Sickle cell trait (HCC)    History reviewed. No pertinent surgical history. Family History  Problem Relation Age of Onset  . Hypertension Mother    Social History  Substance Use Topics  . Smoking status: Never Smoker  . Smokeless tobacco: Not on file  . Alcohol use No   OB History    No data available     Review of Systems  Constitutional: Negative for chills and fever.  HENT: Negative for congestion, ear pain, sore throat, trouble swallowing and voice change.   Respiratory: Negative for cough and shortness of breath.   Cardiovascular: Negative for chest pain and palpitations.  Gastrointestinal: Positive for abdominal pain, nausea and vomiting. Negative for diarrhea.  Genitourinary: Positive for hematuria (darker than usual) and pelvic pain. Negative for decreased urine volume, dysuria, flank pain, frequency, genital sores, urgency and vaginal discharge.  Musculoskeletal: Negative for arthralgias, back pain and myalgias.  Skin: Negative for rash.    Allergies  Patient has no known allergies.  Home  Medications   Prior to Admission medications   Medication Sig Start Date End Date Taking? Authorizing Provider  clindamycin (CLEOCIN) 300 MG capsule Take 1 capsule (300 mg total) by mouth 4 (four) times daily. 06/24/16   Tyrone Nineyan B Grunz, MD  metroNIDAZOLE (FLAGYL) 500 MG tablet Take 1 tablet (500 mg total) by mouth 2 (two) times daily. X 7 days 04/13/16   Hayden Rasmussenavid Mabe, NP  naproxen (NAPROSYN) 500 MG tablet Take 1 tablet (500 mg total) by mouth 2 (two) times daily. 08/17/16   Junius FinnerErin O'Malley, PA-C  ondansetron (ZOFRAN) 4 MG tablet Take 1 tablet (4 mg total) by mouth every 6 (six) hours. 08/17/16   Junius FinnerErin O'Malley, PA-C   Meds Ordered and Administered this Visit  Medications - No data to display  BP 124/81 (BP Location: Left Arm)   Pulse 70   Temp 98 F (36.7 C) (Oral)   Resp 16   LMP 07/20/2016 (Approximate)   SpO2 100%  No data found.   Physical Exam  Constitutional: She appears well-developed and well-nourished. No distress.  HENT:  Head: Normocephalic and atraumatic.  Mouth/Throat: Oropharynx is clear and moist.  Eyes: Conjunctivae are normal. No scleral icterus.  Neck: Normal range of motion.  Cardiovascular: Normal rate, regular rhythm and normal heart sounds.   Pulmonary/Chest: Effort normal and breath sounds normal. No respiratory distress. She has no wheezes. She has no rales.  Abdominal: Soft. Bowel sounds are normal. She exhibits no distension and no mass. There is tenderness ( mild, lower abdomen). There is no rebound, no guarding  and no CVA tenderness.  Musculoskeletal: Normal range of motion.  Neurological: She is alert.  Skin: Skin is warm and dry. She is not diaphoretic.  Nursing note and vitals reviewed.   Urgent Care Course   Clinical Course     Procedures (including critical care time)  Labs Review Labs Reviewed  POCT URINALYSIS DIP (DEVICE) - Abnormal; Notable for the following:       Result Value   Protein, ur 30 (*)    All other components within normal limits   POCT PREGNANCY, URINE    Imaging Review No results found.    MDM   1. Pelvic pain in female   2. Non-intractable vomiting with nausea, unspecified vomiting type    Pt c/o lower abdominal pain, nausea and vomiting. Mild lower abdominal tenderness on exam w/o rebound or guarding.  UA: not concerning for UTI Urine preg: negative Pt declined pelvic exam as she just tested negative for STIs at health department. Rx: zofran and naproxen Encouraged pt [facility] for further evaluation and treatment of symptoms as she may benefit from pelvic U/S to r/o ovarian cysts or fibroids.  Doubt torsion as minimal pain.     Junius Finnerrin O'Malley, PA-C 08/17/16 1909

## 2016-08-17 NOTE — ED Triage Notes (Addendum)
[facility] a [facility] x 1 month. The patient [facility] and [facility] her for STD's and reported them as negative. The patient stated that he menstrual cycle started after the tests and the symptoms went away for a few days but have since returned. The patient reported that the symptoms are worse after eating. The patient denied any pain at triage but did report nausea and episodes of N/V today.

## 2017-08-22 ENCOUNTER — Emergency Department (HOSPITAL_COMMUNITY)
Admission: EM | Admit: 2017-08-22 | Discharge: 2017-08-22 | Disposition: A | Discharge status: Home / Self Care | Payer: Self-pay | Attending: Emergency Medicine | Admitting: Emergency Medicine

## 2017-08-22 ENCOUNTER — Emergency Department (HOSPITAL_COMMUNITY): Payer: Self-pay

## 2017-08-22 ENCOUNTER — Encounter (HOSPITAL_COMMUNITY): Payer: Self-pay | Admitting: Emergency Medicine

## 2017-08-22 DIAGNOSIS — Y906 Blood alcohol level of 120-199 mg/100 ml: Secondary | ICD-10-CM | POA: Insufficient documentation

## 2017-08-22 DIAGNOSIS — M79641 Pain in right hand: Secondary | ICD-10-CM | POA: Insufficient documentation

## 2017-08-22 DIAGNOSIS — D573 Sickle-cell trait: Secondary | ICD-10-CM | POA: Insufficient documentation

## 2017-08-22 DIAGNOSIS — R51 Headache: Secondary | ICD-10-CM | POA: Insufficient documentation

## 2017-08-22 DIAGNOSIS — F10920 Alcohol use, unspecified with intoxication, uncomplicated: Secondary | ICD-10-CM | POA: Insufficient documentation

## 2017-08-22 LAB — RAPID URINE DRUG SCREEN, HOSP PERFORMED
Amphetamines: NOT DETECTED
BARBITURATES: NOT DETECTED
Benzodiazepines: NOT DETECTED
Cocaine: NOT DETECTED
Opiates: NOT DETECTED
Tetrahydrocannabinol: POSITIVE — AB

## 2017-08-22 LAB — ETHANOL: Alcohol, Ethyl (B): 180 mg/dL — ABNORMAL HIGH (ref <10)

## 2017-08-22 MED ORDER — NAPROXEN 500 MG PO TABS: 500.0000 mg | ORAL_TABLET | Freq: Two times a day (BID) | ORAL | 0 refills | Status: DC

## 2017-08-22 NOTE — Discharge Instructions (Signed)
You will be very sore in the next 24 hrs.  You should never drink and drive.  Take naproxen as needed for discomfort.

## 2017-08-22 NOTE — ED Notes (Signed)
Asked for urine  

## 2017-08-22 NOTE — ED Notes (Signed)
Patient transported to X-ray & CT °
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
## 2018-01-03 ENCOUNTER — Other Ambulatory Visit: Payer: Self-pay

## 2018-01-03 ENCOUNTER — Ambulatory Visit (HOSPITAL_COMMUNITY)
Admission: EM | Admit: 2018-01-03 | Discharge: 2018-01-03 | Disposition: A | Discharge status: Home / Self Care | Payer: Self-pay | Attending: Family Medicine | Admitting: Family Medicine

## 2018-01-03 ENCOUNTER — Encounter (HOSPITAL_COMMUNITY): Payer: Self-pay | Admitting: Emergency Medicine

## 2018-01-03 DIAGNOSIS — D573 Sickle-cell trait: Secondary | ICD-10-CM | POA: Insufficient documentation

## 2018-01-03 DIAGNOSIS — N10 Acute tubulo-interstitial nephritis: Secondary | ICD-10-CM | POA: Insufficient documentation

## 2018-01-03 DIAGNOSIS — R300 Dysuria: Secondary | ICD-10-CM

## 2018-01-03 DIAGNOSIS — Z3202 Encounter for pregnancy test, result negative: Secondary | ICD-10-CM

## 2018-01-03 DIAGNOSIS — R350 Frequency of micturition: Secondary | ICD-10-CM

## 2018-01-03 LAB — POCT URINALYSIS DIP (DEVICE)
GLUCOSE, UA: NEGATIVE mg/dL
Ketones, ur: 15 mg/dL — AB
NITRITE: POSITIVE — AB
PH: 6.5 (ref 5.0–8.0)
Specific Gravity, Urine: 1.015 (ref 1.005–1.030)
UROBILINOGEN UA: 2 mg/dL — AB (ref 0.0–1.0)

## 2018-01-03 LAB — POCT PREGNANCY, URINE: PREG TEST UR: NEGATIVE

## 2018-01-03 MED ORDER — CEFDINIR 300 MG PO CAPS: 300.0000 mg | ORAL_CAPSULE | Freq: Two times a day (BID) | ORAL | 0 refills | Status: AC

## 2018-01-03 MED ORDER — CEFTRIAXONE SODIUM 1 G IJ SOLR
1.0000 g | Freq: Once | INTRAMUSCULAR | Status: AC
  Administered 2018-01-03: 1 g via INTRAMUSCULAR

## 2018-01-03 MED ORDER — CEFTRIAXONE SODIUM 1 G IJ SOLR
INTRAMUSCULAR | Status: AC
  Filled 2018-01-03: qty 10

## 2018-01-03 MED ORDER — LIDOCAINE HCL (PF) 1 % IJ SOLN
INTRAMUSCULAR | Status: AC
  Filled 2018-01-03: qty 2

## 2018-01-03 NOTE — ED Triage Notes (Signed)
C/o right side abdominal pain that radiates to back when move or breathe, c/o slight dysuria "when wipe" and urine has an odor

## 2018-01-03 NOTE — ED Provider Notes (Addendum)
  [facility]    CSN: [id number] Arrival date & time: 01/03/18  1711  Chief Complaint  Patient presents with  . Abdominal Cramping    Brenda Fothergillayosha N Fross is a 27 y.o. female here for possible UTI.  Duration: 1 day. Symptoms: urinary frequency, urinary hesitancy, fever, flank pain on right and dysuria Denies: hematuria, urinary retention, nausea, vomiting and urinary incontinence, vaginal discharge Hx of recurrent UTI? No Denies new sexual partners.  ROS:  Constitutional: +fever GU: As noted in HPI  Past Medical History:  Diagnosis Date  . Medical history non-contributory   . Sickle cell trait (HCC)     BP 117/78 (BP Location: Right Arm)   Pulse (!) 107   Temp (!) 101.7 F (38.7 C) (Oral)   LMP 12/27/2017   SpO2 100%  General: Awake, alert, appears stated age HEENT: MMM Heart: tachycardic, reg rhythm, no murmurs Lungs: CTAB, normal respiratory effort, no accessory muscle usage Abd: BS+, soft, +lower quadrant ttp, ND, no masses or organomegaly MSK: +R sided CVA tenderness, neg Lloyd's sign Psych: Age appropriate judgment and insight  Acute pyelonephritis  UA suggestive of infection w LE, nitrites and blood. Given fever and flank tenderness, will tx for Pyelo. 1 g Rocephin today, start Omnicef 300 mg bid for 7 d tomorrow. Stay hydrated. Tylenol, ibuprofen. Seek immediate care if pt starts to develop worsening fevers, new/worsening symptoms, uncontrollable N/V. F/u w pcp prn otherwise. The patient voiced understanding and agreement to the plan.       Sharlene DoryWendling, Porcha Deblanc Paul, [place] 01/03/18 [phone]

## 2018-01-03 NOTE — Discharge Instructions (Signed)
Start oral antibiotics tomorrow. Keep pushing fluids.   OK to take Tylenol 1000 mg (2 extra strength tabs) or 975 mg (3 regular strength tabs) every 6 hours as needed.  Ibuprofen 400-600 mg (2-3 over the counter strength tabs) every 6 hours as needed for pain.  If you start having worsening or new symptoms, go to ER.

## 2018-01-07 LAB — URINE CULTURE

## 2018-01-11 ENCOUNTER — Telehealth (HOSPITAL_COMMUNITY): Payer: Self-pay

## 2018-01-11 NOTE — Telephone Encounter (Signed)
Pt contacted regarding results from recent visit. Reports still having mild symptoms since finishing antibiotic. Encouraged patient to seek medical treatment if symptoms persist. Verbalized understanding.

## 2018-03-02 ENCOUNTER — Ambulatory Visit (HOSPITAL_COMMUNITY)
Admission: EM | Admit: 2018-03-02 | Discharge: 2018-03-02 | Disposition: A | Discharge status: Home / Self Care | Payer: Self-pay | Attending: Family Medicine | Admitting: Family Medicine

## 2018-03-02 ENCOUNTER — Encounter (HOSPITAL_COMMUNITY): Payer: Self-pay | Admitting: Emergency Medicine

## 2018-03-02 DIAGNOSIS — L03317 Cellulitis of buttock: Secondary | ICD-10-CM

## 2018-03-02 MED ORDER — SULFAMETHOXAZOLE-TRIMETHOPRIM 800-160 MG PO TABS: 1.0000 | ORAL_TABLET | Freq: Two times a day (BID) | ORAL | 0 refills | Status: AC

## 2018-03-02 NOTE — ED Triage Notes (Signed)
Pt c/o abscess on her bottom, states hx of abscess before and she wants to get rid of it before it gets worse.

## 2018-03-02 NOTE — ED Provider Notes (Signed)
[facility] CARE CENTER   [id number] 03/02/18 Arrival Time: 1622  ASSESSMENT & PLAN:  1. Cellulitis of buttock    Meds ordered this encounter  Medications  . sulfamethoxazole-trimethoprim (BACTRIM DS,SEPTRA DS) 800-160 MG tablet    Sig: Take 1 tablet by mouth 2 (two) times daily for 10 days.    Dispense:  20 tablet    Refill:  0   Prefers to try antibiotic first before attempting any I&D.  Will f/u here if needed. OTC analgesics as needed. Reviewed expectations re: course of current medical issues. Questions answered. Outlined signs and symptoms indicating need for more acute intervention. Patient verbalized understanding. After Visit Summary given.   SUBJECTIVE:  Brenda Simon is a 27 y.o. female who presents with a skin complaint.   Location: inferior R buttock toward perineum Onset: gradual Duration: a few days Pruritic? No Painful? Yes Progression: stable  Drainage? No  Known trigger? No  New soaps/lotions/topicals/detergents? No Environmental exposures or allergies? none Contacts with similar? No Recent travel? No  Other associated symptoms: none Therapies tried thus far: none Denies fever. No specific aggravating or alleviating factors reported.  ROS: As per HPI.  OBJECTIVE: Vitals:   03/02/18 1647  BP: 117/61  Pulse: 81  Resp: 18  Temp: 98.6 F (37 C)  SpO2: 100%    General appearance: alert; no distress Lungs: clear to auscultation bilaterally Heart: regular rate and rhythm Extremities: no edema Skin: warm and dry; lower L buttock with small area of slight skin thickening; no areas of fluctuance; area is tender; mild overlying erythema Psychological: alert and cooperative; normal mood and affect  No Known Allergies  Past Medical History:  Diagnosis Date  . Medical history non-contributory   . Sickle cell trait (HCC)    Social History   Socioeconomic History  . Marital status: Single    Spouse name: Not on file  . Number of children:  Not on file  . Years of education: Not on file  . Highest education level: Not on file  Occupational History  . Not on file  Social Needs  . Financial resource strain: Not on file  . Food insecurity:    Worry: Not on file    Inability: Not on file  . Transportation needs:    Medical: Not on file    Non-medical: Not on file  Tobacco Use  . Smoking status: Never Smoker  Substance and Sexual Activity  . Alcohol use: No  . Drug use: Yes    Types: Marijuana  . Sexual activity: Not Currently    Birth control/protection: None  Lifestyle  . Physical activity:    Days per week: Not on file    Minutes per session: Not on file  . Stress: Not on file  Relationships  . Social connections:    Talks on phone: Not on file    Gets together: Not on file    Attends religious service: Not on file    Active member of club or organization: Not on file    Attends meetings of clubs or organizations: Not on file    Relationship status: Not on file  . Intimate partner violence:    Fear of current or ex partner: Not on file    Emotionally abused: Not on file    Physically abused: Not on file    Forced sexual activity: Not on file  Other Topics Concern  . Not on file  Social History Narrative  . Not on file   Family  History  Problem Relation Age of Onset  . Hypertension Mother    History reviewed. No pertinent surgical history.   Mardella Layman, MD 03/09/18 [phone]

## 2018-04-04 ENCOUNTER — Inpatient Hospital Stay (HOSPITAL_COMMUNITY)
Admission: AD | Admit: 2018-04-04 | Discharge: 2018-04-04 | Discharge status: Absent without leave | Payer: Self-pay | Source: Ambulatory Visit | Attending: Obstetrics and Gynecology | Admitting: Obstetrics and Gynecology

## 2018-05-17 ENCOUNTER — Encounter (HOSPITAL_COMMUNITY): Payer: Self-pay

## 2018-05-19 ENCOUNTER — Other Ambulatory Visit (HOSPITAL_COMMUNITY): Payer: Self-pay | Admitting: Nurse Practitioner

## 2018-05-19 DIAGNOSIS — Z369 Encounter for antenatal screening, unspecified: Secondary | ICD-10-CM

## 2018-05-20 ENCOUNTER — Encounter (HOSPITAL_COMMUNITY): Payer: Self-pay | Admitting: *Deleted

## 2018-05-24 ENCOUNTER — Other Ambulatory Visit (HOSPITAL_COMMUNITY): Payer: Self-pay | Admitting: Nurse Practitioner

## 2018-05-24 ENCOUNTER — Ambulatory Visit (HOSPITAL_COMMUNITY)
Admission: RE | Admit: 2018-05-24 | Discharge: 2018-05-24 | Disposition: A | Discharge status: Home / Self Care | Payer: Medicaid Other | Source: Ambulatory Visit | Attending: Nurse Practitioner | Admitting: Nurse Practitioner

## 2018-05-24 ENCOUNTER — Encounter (HOSPITAL_COMMUNITY): Payer: Self-pay

## 2018-05-24 DIAGNOSIS — Z3A12 12 weeks gestation of pregnancy: Secondary | ICD-10-CM

## 2018-05-24 DIAGNOSIS — O99011 Anemia complicating pregnancy, first trimester: Secondary | ICD-10-CM | POA: Insufficient documentation

## 2018-05-24 DIAGNOSIS — Z369 Encounter for antenatal screening, unspecified: Secondary | ICD-10-CM

## 2018-05-24 DIAGNOSIS — D573 Sickle-cell trait: Secondary | ICD-10-CM | POA: Insufficient documentation

## 2018-05-24 DIAGNOSIS — Z3682 Encounter for antenatal screening for nuchal translucency: Secondary | ICD-10-CM | POA: Diagnosis present

## 2018-05-30 ENCOUNTER — Other Ambulatory Visit (HOSPITAL_COMMUNITY): Payer: Self-pay

## 2018-06-21 ENCOUNTER — Encounter (HOSPITAL_COMMUNITY): Payer: Self-pay | Admitting: *Deleted

## 2018-06-21 ENCOUNTER — Inpatient Hospital Stay (HOSPITAL_COMMUNITY)
Admission: AD | Admit: 2018-06-21 | Discharge: 2018-06-21 | Disposition: A | Discharge status: Home / Self Care | Payer: Medicaid Other | Source: Ambulatory Visit | Attending: Obstetrics & Gynecology | Admitting: Obstetrics & Gynecology

## 2018-06-21 DIAGNOSIS — Z3A16 16 weeks gestation of pregnancy: Secondary | ICD-10-CM

## 2018-06-21 DIAGNOSIS — O219 Vomiting of pregnancy, unspecified: Secondary | ICD-10-CM | POA: Insufficient documentation

## 2018-06-21 DIAGNOSIS — Z79899 Other long term (current) drug therapy: Secondary | ICD-10-CM | POA: Insufficient documentation

## 2018-06-21 DIAGNOSIS — Z87891 Personal history of nicotine dependence: Secondary | ICD-10-CM | POA: Diagnosis not present

## 2018-06-21 DIAGNOSIS — R42 Dizziness and giddiness: Secondary | ICD-10-CM | POA: Diagnosis not present

## 2018-06-21 DIAGNOSIS — D573 Sickle-cell trait: Secondary | ICD-10-CM | POA: Insufficient documentation

## 2018-06-21 DIAGNOSIS — R197 Diarrhea, unspecified: Secondary | ICD-10-CM | POA: Diagnosis not present

## 2018-06-21 DIAGNOSIS — O9989 Other specified diseases and conditions complicating pregnancy, childbirth and the puerperium: Secondary | ICD-10-CM | POA: Diagnosis not present

## 2018-06-21 DIAGNOSIS — O26892 Other specified pregnancy related conditions, second trimester: Secondary | ICD-10-CM | POA: Diagnosis not present

## 2018-06-21 HISTORY — DX: Chlamydial infection, unspecified: A74.9

## 2018-06-21 LAB — CBC WITH DIFFERENTIAL/PLATELET
BASOS PCT: 0 %
Basophils Absolute: 0 10*3/uL (ref 0.0–0.1)
EOS ABS: 0.1 10*3/uL (ref 0.0–0.7)
EOS PCT: 1 %
HCT: 31.4 % — ABNORMAL LOW (ref 36.0–46.0)
Hemoglobin: 10.8 g/dL — ABNORMAL LOW (ref 12.0–15.0)
LYMPHS ABS: 1.4 10*3/uL (ref 0.7–4.0)
Lymphocytes Relative: 17 %
MCH: 27.8 pg (ref 26.0–34.0)
MCHC: 34.4 g/dL (ref 30.0–36.0)
MCV: 80.7 fL (ref 78.0–100.0)
Monocytes Absolute: 0.5 10*3/uL (ref 0.1–1.0)
Monocytes Relative: 6 %
Neutro Abs: 6.2 10*3/uL (ref 1.7–7.7)
Neutrophils Relative %: 76 %
PLATELETS: 227 10*3/uL (ref 150–400)
RBC: 3.89 MIL/uL (ref 3.87–5.11)
RDW: 13.4 % (ref 11.5–15.5)
WBC: 8.1 10*3/uL (ref 4.0–10.5)

## 2018-06-21 LAB — COMPREHENSIVE METABOLIC PANEL
ALT: 12 U/L (ref 0–44)
ANION GAP: 9 (ref 5–15)
AST: 17 U/L (ref 15–41)
Albumin: 3.4 g/dL — ABNORMAL LOW (ref 3.5–5.0)
Alkaline Phosphatase: 47 U/L (ref 38–126)
BUN: 8 mg/dL (ref 6–20)
CHLORIDE: 103 mmol/L (ref 98–111)
CO2: 20 mmol/L — AB (ref 22–32)
CREATININE: 0.55 mg/dL (ref 0.44–1.00)
Calcium: 9.1 mg/dL (ref 8.9–10.3)
GFR calc non Af Amer: >60 mL/min (ref >60)
Glucose, Bld: 80 mg/dL (ref 70–99)
Potassium: 3.9 mmol/L (ref 3.5–5.1)
SODIUM: 132 mmol/L — AB (ref 135–145)
Total Bilirubin: 0.7 mg/dL (ref 0.3–1.2)
Total Protein: 6.8 g/dL (ref 6.5–8.1)

## 2018-06-21 LAB — URINALYSIS, ROUTINE W REFLEX MICROSCOPIC
BILIRUBIN URINE: NEGATIVE
Glucose, UA: NEGATIVE mg/dL
HGB URINE DIPSTICK: NEGATIVE
KETONES UR: NEGATIVE mg/dL
Leukocytes, UA: NEGATIVE
Nitrite: NEGATIVE
PROTEIN: NEGATIVE mg/dL
SPECIFIC GRAVITY, URINE: 1.02 (ref 1.005–1.030)
pH: 5 (ref 5.0–8.0)

## 2018-06-21 NOTE — Discharge Instructions (Signed)
Hypoglycemia Hypoglycemia is when the sugar (glucose) level in the blood is too low. Symptoms of low blood sugar may include:  Feeling: ? Hungry. ? Worried or nervous (anxious). ? Sweaty and clammy. ? Confused. ? Dizzy. ? Sleepy. ? Sick to your stomach (nauseous).  Having: ? A fast heartbeat. ? A headache. ? A change in your vision. ? Jerky movements that you cannot control (seizure). ? Nightmares. ? Tingling or no feeling (numbness) around the mouth, lips, or tongue.  Having trouble with: ? Talking. ? Paying attention (concentrating). ? Moving (coordination). ? Sleeping.  Shaking.  Passing out (fainting).  Getting upset easily (irritability).  Low blood sugar can happen to people who have diabetes and people who do not have diabetes. Low blood sugar can happen quickly, and it can be an emergency. Treating Low Blood Sugar Low blood sugar is often treated by eating or drinking something sugary right away. If you can think clearly and swallow safely, follow the 15:15 rule:  Take 15 grams of a fast-acting carb (carbohydrate). Some fast-acting carbs are: ? 1 tube of glucose gel. ? 3 sugar tablets (glucose pills). ? 6-8 pieces of hard candy. ? 4 oz (120 mL) of fruit juice. ? 4 oz (120 mL) of regular (not diet) soda.  Check your blood sugar 15 minutes after you take the carb.  If your blood sugar is still at or below 70 mg/dL (3.9 mmol/L), take 15 grams of a carb again.  If your blood sugar does not go above 70 mg/dL (3.9 mmol/L) after 3 tries, get help right away.  After your blood sugar goes back to normal, eat a meal or a snack within 1 hour.  Treating Very Low Blood Sugar If your blood sugar is at or below 54 mg/dL (3 mmol/L), you have very low blood sugar (severe hypoglycemia). This is an emergency. Do not wait to see if the symptoms will go away. Get medical help right away. Call your local emergency services (911 in the [place] Do not drive yourself to the  hospital. If you have very low blood sugar and you cannot eat or drink, you may need a glucagon shot (injection). A family member or friend should learn how to check your blood sugar and how to give you a glucagon shot. Ask your doctor if you need to have a glucagon shot kit at home. Follow these instructions at home: General instructions  Avoid any diets that cause you to not eat enough food. Talk with your doctor before you start any new diet.  Take over-the-counter and prescription medicines only as told by your doctor.  Limit alcohol to no more than 1 drink per day for nonpregnant women and 2 drinks per day for men. One drink equals 12 oz of beer, 5 oz of wine, or 1 oz of hard liquor.  Keep all follow-up visits as told by your doctor. This is important. If You Have Diabetes:   Make sure you know the symptoms of low blood sugar.  Always keep a source of sugar with you, such as: ? Sugar. ? Sugar tablets. ? Glucose gel. ? Fruit juice. ? Regular soda (not diet soda). ? Milk. ? Hard candy. ? Honey.  Take your medicines as told.  Follow your exercise and meal plan. ? Eat on time. Do not skip meals. ? Follow your sick day plan when you cannot eat or drink normally. Make this plan ahead of time with your doctor.  Check your blood sugar as often  as told by your doctor. Always check before and after exercise.  Share your diabetes care plan with: ? Your work or school. ? People you live with.  Check your pee (urine) for ketones: ? When you are sick. ? As told by your doctor.  Carry a card or wear jewelry that says you have diabetes. If You Have Low Blood Sugar From Other Causes:   Check your blood sugar as often as told by your doctor.  Follow instructions from your doctor about what you cannot eat or drink. Contact a doctor if:  You have trouble keeping your blood sugar in your target range.  You have low blood sugar often. Get help right away if:  You still have  symptoms after you eat or drink something sugary.  Your blood sugar is at or below 54 mg/dL (3 mmol/L).  You have jerky movements that you cannot control.  You pass out. These symptoms may be an emergency. Do not wait to see if the symptoms will go away. Get medical help right away. Call your local emergency services (911 in the [place] Do not drive yourself to the hospital. This information is not intended to replace advice given to you by your health care provider. Make sure you discuss any questions you have with your health care provider. Document Released: 12/23/2009 Document Revised: 03/05/2016 Document Reviewed: 11/01/2015 Elsevier Interactive Patient Education  [employer].

## 2018-06-21 NOTE — MAU Provider Note (Signed)
History     CSN: [id number]  Arrival date and time: 06/21/18 0808   First Provider Initiated Contact with Patient 06/21/18 0902      Chief Complaint  Patient presents with  . Dizziness   Brenda Simon is a 27 y.o. G1P0 at [redacted]w[redacted]d who presents today with dizziness. She states that she got to work around 0600 and this episode started around 0700. 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 She denies LOC or fall or head trauma. She had not eaten anything prior to work, but did drink water. She states that when it occurred she felt hot, sweaty, dizzy and "like she was going to pass out". Vomiting with one episode 2 days ago, and diarrhea 3 episodes since last night.   Dizziness  This is a new problem. The current episode started today. The problem has been resolved. Associated symptoms include headaches. Pertinent negatives include no chest pain, chills or fever. Exacerbated by: hot, not eating this morning  She has tried position changes, rest and drinking for the symptoms. The treatment provided significant relief.    OB History    Gravida  1   Para      Term      Preterm      AB      Living  0     SAB      TAB      Ectopic      Multiple      Live Births              Past Medical History:  Diagnosis Date  . Chlamydia   . Sickle cell trait [facility])     Past Surgical History:  Procedure Laterality Date  . NO PAST SURGERIES      Family History  Problem Relation Age of Onset  . Hypertension Mother     Social History   Tobacco Use  . Smoking status: Former Smoker    Packs/day: 1.50    Types: Cigars  . Smokeless tobacco: Never Used  . Tobacco comment: stopped after pos. UPT  Substance Use Topics  . Alcohol use: No  . Drug use: Not Currently    Types: Marijuana    Allergies: No Known Allergies  Medications Prior to Admission  Medication Sig Dispense Refill Last Dose  . Prenatal Vit w/Fe-Methylfol-FA (PNV PO)  Take by mouth.   Taking    Review of Systems  Constitutional: Negative for chills and fever.  Respiratory: Negative for shortness of breath.   Cardiovascular: Negative for chest pain.  Genitourinary: Negative for pelvic pain and vaginal bleeding.  Neurological: Positive for dizziness, light-headedness and headaches.   Physical Exam   Blood pressure (!) 103/53, pulse 83, temperature 98.2 F (36.8 C), temperature source Oral, resp. rate 16, height 5\' 4"  (1.626 m), weight 64 kg, last menstrual period 02/18/2018, SpO2 100 %.  Physical Exam  Nursing note and vitals reviewed. Constitutional: She is oriented to person, place, and time. She appears well-developed and well-nourished. No distress.  HENT:  Head: Normocephalic.  Cardiovascular: Normal rate and regular rhythm.  Respiratory: Effort normal and breath sounds normal.  GI: Soft. There is no tenderness.  Neurological: She is alert and oriented to person, place, and time.  Skin: Skin is warm and dry.  Psychiatric: She has a normal mood and affect.     EKG: NSR   Results for orders placed or performed during the hospital encounter of  06/21/18 (from the past 24 hour(s))  Urinalysis, Routine w reflex microscopic     Status: Abnormal   Collection Time: 06/21/18  8:54 AM  Result Value Ref Range   Color, Urine AMBER (A) YELLOW   APPearance CLOUDY (A) CLEAR   Specific Gravity, Urine 1.020 1.005 - 1.030   pH 5.0 5.0 - 8.0   Glucose, UA NEGATIVE NEGATIVE mg/dL   Hgb urine dipstick NEGATIVE NEGATIVE   Bilirubin Urine NEGATIVE NEGATIVE   Ketones, ur NEGATIVE NEGATIVE mg/dL   Protein, ur NEGATIVE NEGATIVE mg/dL   Nitrite NEGATIVE NEGATIVE   Leukocytes, UA NEGATIVE NEGATIVE  CBC with Differential/Platelet     Status: Abnormal   Collection Time: 06/21/18  9:36 AM  Result Value Ref Range   WBC 8.1 4.0 - 10.5 K/uL   RBC 3.89 3.87 - 5.11 MIL/uL   Hemoglobin 10.8 (L) 12.0 - 15.0 g/dL   HCT 16.1 (L) 09.6 - 04.5 %   MCV 80.7 78.0 -  100.0 fL   MCH 27.8 26.0 - 34.0 pg   MCHC 34.4 30.0 - 36.0 g/dL   RDW 40.9 81.1 - 91.4 %   Platelets 227 150 - 400 K/uL   Neutrophils Relative % 76 %   Neutro Abs 6.2 1.7 - 7.7 K/uL   Lymphocytes Relative 17 %   Lymphs Abs 1.4 0.7 - 4.0 K/uL   Monocytes Relative 6 %   Monocytes Absolute 0.5 0.1 - 1.0 K/uL   Eosinophils Relative 1 %   Eosinophils Absolute 0.1 0.0 - 0.7 K/uL   Basophils Relative 0 %   Basophils Absolute 0.0 0.0 - 0.1 K/uL  Comprehensive metabolic panel     Status: Abnormal   Collection Time: 06/21/18  9:36 AM  Result Value Ref Range   Sodium 132 (L) 135 - 145 mmol/L   Potassium 3.9 3.5 - 5.1 mmol/L   Chloride 103 98 - 111 mmol/L   CO2 20 (L) 22 - 32 mmol/L   Glucose, Bld 80 70 - 99 mg/dL   BUN 8 6 - 20 mg/dL   Creatinine, Ser 7.82 0.44 - 1.00 mg/dL   Calcium 9.1 8.9 - 95.6 mg/dL   Total Protein 6.8 6.5 - 8.1 g/dL   Albumin 3.4 (L) 3.5 - 5.0 g/dL   AST 17 15 - 41 U/L   ALT 12 0 - 44 U/L   Alkaline Phosphatase 47 38 - 126 U/L   Total Bilirubin 0.7 0.3 - 1.2 mg/dL   GFR calc non Af Amer >60 >60 mL/min   GFR calc Af Amer >60 >60 mL/min   Anion gap 9 5 - 15    MAU Course  Procedures  MDM   Assessment and Plan   1. Dizzinesses   2. [redacted] weeks gestation of pregnancy    DC home Comfort measures reviewed  2nd/3rd Trimester precautions  PTL precautions  Fetal kick counts RX: no new RX  Return to MAU as needed FU with OB as planned  Follow-up Information    Department, [facility] Follow up.   Contact information: [address] [place] [address] [phone]            Thressa Sheller 06/21/2018, 10:15 AM

## 2018-06-21 NOTE — MAU Note (Signed)
Pt was at work, works in [employer], became hot & dizzy, thought she may pass out.  Has had water this morning, hasn't eaten.  Has slight HA, denies abdominal pain or vaginal bleeding.

## 2018-07-04 ENCOUNTER — Inpatient Hospital Stay (HOSPITAL_COMMUNITY)
Admission: AD | Admit: 2018-07-04 | Discharge: 2018-07-04 | Disposition: A | Discharge status: Home / Self Care | Payer: Medicaid Other | Source: Ambulatory Visit | Attending: Obstetrics and Gynecology | Admitting: Obstetrics and Gynecology

## 2018-07-04 ENCOUNTER — Other Ambulatory Visit: Payer: Self-pay

## 2018-07-04 ENCOUNTER — Encounter (HOSPITAL_COMMUNITY): Payer: Self-pay

## 2018-07-04 DIAGNOSIS — R109 Unspecified abdominal pain: Secondary | ICD-10-CM | POA: Diagnosis not present

## 2018-07-04 DIAGNOSIS — Z87891 Personal history of nicotine dependence: Secondary | ICD-10-CM | POA: Insufficient documentation

## 2018-07-04 DIAGNOSIS — Z3A18 18 weeks gestation of pregnancy: Secondary | ICD-10-CM | POA: Diagnosis not present

## 2018-07-04 DIAGNOSIS — O26892 Other specified pregnancy related conditions, second trimester: Secondary | ICD-10-CM | POA: Insufficient documentation

## 2018-07-04 DIAGNOSIS — N949 Unspecified condition associated with female genital organs and menstrual cycle: Secondary | ICD-10-CM

## 2018-07-04 DIAGNOSIS — R1030 Lower abdominal pain, unspecified: Secondary | ICD-10-CM | POA: Diagnosis present

## 2018-07-04 LAB — URINALYSIS, ROUTINE W REFLEX MICROSCOPIC
BILIRUBIN URINE: NEGATIVE
GLUCOSE, UA: NEGATIVE mg/dL
HGB URINE DIPSTICK: NEGATIVE
Ketones, ur: NEGATIVE mg/dL
Leukocytes, UA: NEGATIVE
Nitrite: NEGATIVE
Protein, ur: NEGATIVE mg/dL
SPECIFIC GRAVITY, URINE: 1.01 (ref 1.005–1.030)
pH: 6 (ref 5.0–8.0)

## 2018-07-04 MED ORDER — IBUPROFEN 600 MG PO TABS
600.0000 mg | ORAL_TABLET | Freq: Once | ORAL | Status: AC
  Administered 2018-07-04: 600 mg via ORAL
  Filled 2018-07-04: qty 1

## 2018-07-04 MED ORDER — COMFORT FIT MATERNITY SUPP LG MISC: 1.0000 | Freq: Every day | 0 refills | Status: DC | PRN

## 2018-07-04 NOTE — Discharge Instructions (Signed)
Round Ligament Pain During Pregnancy   Round ligament pain is a sharp pain or jabbing feeling often felt in the lower belly or groin area on one or both sides. It is one of the most common complaints during pregnancy and is considered a normal part of pregnancy. It is most often felt during the second trimester.   Here is what you need to know about round ligament pain, including some tips to help you feel better.   Causes of Round Ligament Pain:    Several thick ligaments surround and support your womb (uterus) as it grows during pregnancy. One of them is called the round ligament.   The round ligament connects the front part of the womb to your groin, the area where your legs attach to your pelvis. The round ligament normally tightens and relaxes slowly.   As your baby and womb grow, the round ligament stretches. That makes it more likely to become strained.   Sudden movements can cause the ligament to tighten quickly, like a rubber band snapping. This causes a sudden and quick jabbing feeling.   Symptoms of Round Ligament Pain   Round ligament pain can be concerning and uncomfortable. But it is considered normal as your body changes during pregnancy.   The symptoms of round ligament pain include a sharp, sudden spasm in the belly. It usually affects the right side, but it may happen on both sides. The pain only lasts a few seconds.   Exercise may cause the pain, as will rapid movements such as:   sneezing  coughing  laughing  rolling over in bed  standing up too quickly   Treatment of Round Ligament Pain   Here are some tips that may help reduce your discomfort:   Pain relief. Take over-the-counter acetaminophen for pain, if necessary. Ask your doctor if this is OK.   Exercise. Get plenty of exercise to keep your stomach (core) muscles strong. Doing stretching exercises or prenatal yoga can be helpful. Ask your doctor which exercises are safe for you and your baby.   A  helpful exercise involves putting your hands and knees on the floor, lowering your head, and pushing your backside into the air.   Avoid sudden movements. Change positions slowly (such as standing up or sitting down) to avoid sudden movements that may cause stretching and pain.   Flex your hips. Bend and flex your hips before you cough, sneeze, or laugh to avoid pulling on the ligaments.   Apply warmth. A heating pad or warm bath may be helpful. Ask your doctor if this is OK. Extreme heat can be dangerous to the baby.   You should try to modify your daily activity level and avoid positions that may worsen the condition.   When to Call the Doctor/Midwife   Always tell your doctor or midwife about any type of pain you have during pregnancy. Round ligament pain is quick and doesn't last long.   Call your health care provider immediately if you have:   severe pain  fever  chills  pain on urination  difficulty walking   Belly pain during pregnancy can be due to many different causes. It is important for your doctor to rule out more serious conditions, including pregnancy complications such as placenta abruption or non-pregnancy illnesses such as:   inguinal hernia  appendicitis  stomach, liver, and kidney problems  Preterm labor pains may sometimes be mistaken for round ligament pain.              PREGNANCY SUPPORT BELT: You are not alone, Seventy-five percent of women have some sort of abdominal or back pain at some point in their pregnancy. Your baby is growing at a fast pace, which means that your whole body is rapidly trying to adjust to the changes. As your uterus grows, your back may start feeling a bit under stress and this can result in back or abdominal pain that can go from mild, and therefore bearable, to severe pains that will not allow you to sit or lay down comfortably, When it comes to dealing with pregnancy-related pains and cramps, some pregnant women usually prefer  natural remedies, which the market is filled with nowadays. For example, wearing a pregnancy support belt can help ease and lessen your discomfort and pain. WHAT ARE THE BENEFITS OF WEARING A PREGNANCY SUPPORT BELT? A pregnancy support belt provides support to the lower portion of the belly taking some of the weight of the growing uterus and distributing to the other parts of your body. It is designed make you comfortable and gives you extra support. Over the years, the pregnancy apparel market has been studying the needs and wants of pregnant women and they have come up with the most comfortable pregnancy support belts that woman could ever ask for. In fact, you will no longer have to wear a stretched-out or bulky pregnancy belt that is visible underneath your clothes and makes you feel even more uncomfortable. Nowadays, a pregnancy support belt is made of comfortable and stretchy materials that will not irritate your skin but will actually make you feel at ease and you will not even notice you are wearing it. They are easy to put on and adjust during the day and can be worn at night for additional support.  BENEFITS: . Relives Back pain . Relieves Abdominal Muscle and Leg Pain . Stabilizes the Pelvic Ring . Offers a Cushioned Abdominal Lift Pad . Relieves pressure on the Sciatic Nerve Within Minutes WHERE TO GET YOUR PREGNANCY BELT: [employer] [phone] @[address] [place], [place] [address]  

## 2018-07-04 NOTE — MAU Provider Note (Signed)
History     CSN: [id number]  Arrival date and time: 07/04/18 1237   First Provider Initiated Contact with Patient 07/04/18 1358      Chief Complaint  Patient presents with  . Abdominal Pain   HPI   Ms.Brenda Simon is a 27 y.o. female G1P0 @ 819w3d here in MAU with complaints of lower abdominal pain. The pain started 1 week ago. The pain worsens at night when she changes positions in the bed, and the pain is worse when she stands at work. She is on her feet 10+ hours during the day at work. She has recently worked 14 days in a row. The pain does not shoot down her legs, the pain does radiate to both hips.  No bleeding. She is requesting a work note today.   OB History    Gravida  1   Para      Term      Preterm      AB      Living  0     SAB      TAB      Ectopic      Multiple      Live Births              Past Medical History:  Diagnosis Date  . Chlamydia   . Sickle cell trait [facility](HCC)     Past Surgical History:  Procedure Laterality Date  . NO PAST SURGERIES      Family History  Problem Relation Age of Onset  . Hypertension Mother     Social History   Tobacco Use  . Smoking status: Former Smoker    Packs/day: 1.50    Types: Cigars  . Smokeless tobacco: Never Used  . Tobacco comment: stopped after pos. UPT  Substance Use Topics  . Alcohol use: No  . Drug use: Not Currently    Types: Marijuana    Allergies: No Known Allergies  Medications Prior to Admission  Medication Sig Dispense Refill Last Dose  . Prenatal Vit w/Fe-Methylfol-FA (PNV PO) Take 1 tablet by mouth daily.    07/03/2018 at Unknown time   Results for orders placed or performed during the hospital encounter of 07/04/18 (from the past 48 hour(s))  Urinalysis, Routine w reflex microscopic     Status: Abnormal   Collection Time: 07/04/18  1:31 PM  Result Value Ref Range   Color, Urine STRAW (A) YELLOW   APPearance CLEAR CLEAR   Specific Gravity, Urine 1.010 1.005 - 1.030   pH 6.0 5.0 - 8.0   Glucose, UA NEGATIVE NEGATIVE mg/dL   Hgb urine dipstick NEGATIVE NEGATIVE   Bilirubin Urine NEGATIVE NEGATIVE   Ketones, ur NEGATIVE NEGATIVE mg/dL   Protein, ur NEGATIVE NEGATIVE mg/dL   Nitrite NEGATIVE NEGATIVE   Leukocytes, UA NEGATIVE NEGATIVE    Comment: Performed at [facility], [address] [place], [place] [id number]   Review of Systems  Constitutional: Negative for fever.  Gastrointestinal: Positive for abdominal pain.  Genitourinary: Negative for dysuria.   Physical Exam   Blood pressure 118/63, pulse 80, temperature 98.1 F (36.7 C), temperature source Oral, resp. rate 16, weight 66.9 kg, last menstrual period 02/18/2018, SpO2 100 %.  Physical Exam  Constitutional: She is oriented to person, place, and time. She appears well-developed and well-nourished. No distress.  HENT:  Head: Normocephalic.  Eyes: Pupils are equal, round, and reactive to light.  GI: Soft. She exhibits no distension. There is no tenderness. There  is no rebound and no guarding.  Genitourinary:  Genitourinary Comments: Dilation: Closed Effacement (%): Thick Cervical Position: Posterior Exam by:: Aldyn Toon, NP  Musculoskeletal: Normal range of motion.  Neurological: She is alert and oriented to person, place, and time.  Skin: Skin is warm. She is not diaphoretic.  Psychiatric: Her behavior is normal.    MAU Course  Procedures  None  MDM  + fetal heart tones via doppler  UA Pain is consistent with round ligament pain   Assessment and Plan   A:  1. Round ligament pain   2. [redacted] weeks gestation of pregnancy     P:  Discharge home in stable condition Rx: Pregnancy maternity belt  Return to MAU if symptoms worsen  F/u with ob as scheduled or sooner if needed.    Duane Lope, NP 07/06/2018 3:51 PM

## 2018-07-04 NOTE — MAU Note (Signed)
Probably since last wk, when she lays on her side she has pain.  Today she is having pain in lower abd, sometimes goes around to back, noted when she is standing and walking.

## 2018-12-05 ENCOUNTER — Encounter (HOSPITAL_COMMUNITY): Payer: Self-pay | Admitting: *Deleted

## 2018-12-05 ENCOUNTER — Inpatient Hospital Stay (HOSPITAL_COMMUNITY)
Admission: AD | Admit: 2018-12-05 | Discharge: 2018-12-09 | DRG: 787 | Disposition: A | Discharge status: Home / Self Care | Payer: Medicaid Other | Attending: Family Medicine | Admitting: Family Medicine

## 2018-12-05 ENCOUNTER — Other Ambulatory Visit: Payer: Self-pay

## 2018-12-05 ENCOUNTER — Inpatient Hospital Stay (HOSPITAL_COMMUNITY)
Admission: AD | Admit: 2018-12-05 | Discharge: 2018-12-05 | Disposition: A | Discharge status: Still In Hospital | Payer: Medicaid Other | Attending: Obstetrics & Gynecology | Admitting: Obstetrics & Gynecology

## 2018-12-05 DIAGNOSIS — O99324 Drug use complicating childbirth: Secondary | ICD-10-CM | POA: Diagnosis present

## 2018-12-05 DIAGNOSIS — F1290 Cannabis use, unspecified, uncomplicated: Secondary | ICD-10-CM | POA: Diagnosis not present

## 2018-12-05 DIAGNOSIS — Z3A40 40 weeks gestation of pregnancy: Secondary | ICD-10-CM | POA: Diagnosis not present

## 2018-12-05 DIAGNOSIS — D573 Sickle-cell trait: Secondary | ICD-10-CM | POA: Diagnosis not present

## 2018-12-05 DIAGNOSIS — O4100X0 Oligohydramnios, unspecified trimester, not applicable or unspecified: Secondary | ICD-10-CM | POA: Diagnosis present

## 2018-12-05 DIAGNOSIS — O99824 Streptococcus B carrier state complicating childbirth: Secondary | ICD-10-CM | POA: Diagnosis not present

## 2018-12-05 DIAGNOSIS — O4103X0 Oligohydramnios, third trimester, not applicable or unspecified: Secondary | ICD-10-CM | POA: Diagnosis not present

## 2018-12-05 DIAGNOSIS — Z87891 Personal history of nicotine dependence: Secondary | ICD-10-CM

## 2018-12-05 DIAGNOSIS — O9902 Anemia complicating childbirth: Secondary | ICD-10-CM | POA: Diagnosis present

## 2018-12-05 DIAGNOSIS — O480 Post-term pregnancy: Secondary | ICD-10-CM | POA: Diagnosis not present

## 2018-12-05 LAB — ABO/RH: ABO/RH(D): O POS

## 2018-12-05 LAB — CBC
HCT: 36.3 % (ref 36.0–46.0)
Hemoglobin: 11.7 g/dL — ABNORMAL LOW (ref 12.0–15.0)
MCH: 26 pg (ref 26.0–34.0)
MCHC: 32.2 g/dL (ref 30.0–36.0)
MCV: 80.7 fL (ref 80.0–100.0)
Platelets: 277 10*3/uL (ref 150–400)
RBC: 4.5 MIL/uL (ref 3.87–5.11)
RDW: 15.6 % — ABNORMAL HIGH (ref 11.5–15.5)
WBC: 7.3 10*3/uL (ref 4.0–10.5)
nRBC: 0 % (ref 0.0–0.2)

## 2018-12-05 LAB — RAPID URINE DRUG SCREEN, HOSP PERFORMED
AMPHETAMINES: NOT DETECTED
Barbiturates: NOT DETECTED
Benzodiazepines: NOT DETECTED
Cocaine: NOT DETECTED
Opiates: NOT DETECTED
Tetrahydrocannabinol: NOT DETECTED

## 2018-12-05 LAB — TYPE AND SCREEN
ABO/RH(D): O POS
Antibody Screen: NEGATIVE

## 2018-12-05 MED ORDER — ONDANSETRON HCL 4 MG/2ML IJ SOLN
4.0000 mg | Freq: Four times a day (QID) | INTRAMUSCULAR | Status: DC | PRN
  Administered 2018-12-06: 4 mg via INTRAVENOUS

## 2018-12-05 MED ORDER — LACTATED RINGERS IV SOLN
500.0000 mL | Freq: Once | INTRAVENOUS | Status: AC
  Administered 2018-12-06: 500 mL via INTRAVENOUS

## 2018-12-05 MED ORDER — EPHEDRINE 5 MG/ML INJ: 10.0000 mg | INTRAVENOUS | Status: DC | PRN

## 2018-12-05 MED ORDER — PHENYLEPHRINE 40 MCG/ML (10ML) SYRINGE FOR IV PUSH (FOR BLOOD PRESSURE SUPPORT): 80.0000 ug | PREFILLED_SYRINGE | INTRAVENOUS | Status: DC | PRN

## 2018-12-05 MED ORDER — FENTANYL-BUPIVACAINE-NACL 0.5-0.125-0.9 MG/250ML-% EP SOLN
12.0000 mL/h | EPIDURAL | Status: DC | PRN
  Filled 2018-12-05: qty 250

## 2018-12-05 MED ORDER — PENICILLIN G 3 MILLION UNITS IVPB - SIMPLE MED
3.0000 10*6.[IU] | INTRAVENOUS | Status: DC
  Administered 2018-12-06 (×4): 3 10*6.[IU] via INTRAVENOUS
  Filled 2018-12-05 (×4): qty 100

## 2018-12-05 MED ORDER — FENTANYL CITRATE (PF) 100 MCG/2ML IJ SOLN
100.0000 ug | INTRAMUSCULAR | Status: DC | PRN
  Administered 2018-12-05 – 2018-12-06 (×6): 100 ug via INTRAVENOUS
  Filled 2018-12-05 (×6): qty 2

## 2018-12-05 MED ORDER — LACTATED RINGERS IV SOLN
INTRAVENOUS | Status: DC
  Administered 2018-12-05 – 2018-12-06 (×5): via INTRAVENOUS

## 2018-12-05 MED ORDER — SOD CITRATE-CITRIC ACID 500-334 MG/5ML PO SOLN
30.0000 mL | ORAL | Status: DC | PRN
  Administered 2018-12-06: 30 mL via ORAL
  Filled 2018-12-05: qty 15

## 2018-12-05 MED ORDER — DIPHENHYDRAMINE HCL 50 MG/ML IJ SOLN: 12.5000 mg | INTRAMUSCULAR | Status: DC | PRN

## 2018-12-05 MED ORDER — MISOPROSTOL 25 MCG QUARTER TABLET
25.0000 ug | ORAL_TABLET | ORAL | Status: DC | PRN
  Administered 2018-12-05 (×2): 25 ug via VAGINAL
  Filled 2018-12-05 (×2): qty 1

## 2018-12-05 MED ORDER — LIDOCAINE HCL (PF) 1 % IJ SOLN: 30.0000 mL | INTRAMUSCULAR | Status: DC | PRN

## 2018-12-05 MED ORDER — LACTATED RINGERS IV SOLN
500.0000 mL | INTRAVENOUS | Status: DC | PRN
  Administered 2018-12-05 – 2018-12-06 (×3): 500 mL via INTRAVENOUS

## 2018-12-05 MED ORDER — TERBUTALINE SULFATE 1 MG/ML IJ SOLN: 0.2500 mg | Freq: Once | INTRAMUSCULAR | Status: DC | PRN

## 2018-12-05 MED ORDER — FLEET ENEMA 7-19 GM/118ML RE ENEM: 1.0000 | ENEMA | RECTAL | Status: DC | PRN

## 2018-12-05 MED ORDER — OXYTOCIN BOLUS FROM INFUSION: 500.0000 mL | Freq: Once | INTRAVENOUS | Status: DC

## 2018-12-05 MED ORDER — OXYTOCIN 40 UNITS IN NORMAL SALINE INFUSION - SIMPLE MED: 2.5000 [IU]/h | INTRAVENOUS | Status: DC

## 2018-12-05 MED ORDER — SODIUM CHLORIDE 0.9 % IV SOLN
5.0000 10*6.[IU] | Freq: Once | INTRAVENOUS | Status: AC
  Administered 2018-12-05: 5 10*6.[IU] via INTRAVENOUS
  Filled 2018-12-05: qty 5

## 2018-12-05 NOTE — H&P (Addendum)
LABOR AND DELIVERY ADMISSION HISTORY AND PHYSICAL NOTE  Brenda Simon is a 28 y.o. female G1P0 with IUP at 103w3d by LMP presenting for IOL for oligohydramnios.  She reports positive fetal movement. She denies leakage of fluid or vaginal bleeding.  Prenatal History/Complications: PNC at [facility] Pregnancy complications:  - oligohydramnios (AFI 2) found on BPP today  - tobacco use  - THC+ July 2019  - sickle cell trait   Past Medical History: Past Medical History:  Diagnosis Date  . Chlamydia   . Sickle cell trait [facility])     Past Surgical History: Past Surgical History:  Procedure Laterality Date  . NO PAST SURGERIES      Obstetrical History: OB History    Gravida  1   Para      Term      Preterm      AB      Living  0     SAB      TAB      Ectopic      Multiple      Live Births              Social History: Social History   Socioeconomic History  . Marital status: Single    Spouse name: Not on file  . Number of children: Not on file  . Years of education: Not on file  . Highest education level: Not on file  Occupational History  . Not on file  Social Needs  . Financial resource strain: Not on file  . Food insecurity:    Worry: Not on file    Inability: Not on file  . Transportation needs:    Medical: Not on file    Non-medical: Not on file  Tobacco Use  . Smoking status: Former Smoker    Packs/day: 1.50    Types: Cigars  . Smokeless tobacco: Never Used  . Tobacco comment: stopped after pos. UPT  Substance and Sexual Activity  . Alcohol use: No  . Drug use: Not Currently    Types: Marijuana  . Sexual activity: Yes    Birth control/protection: None  Lifestyle  . Physical activity:    Days per week: Not on file    Minutes per session: Not on file  . Stress: Not on file  Relationships  . Social connections:    Talks on phone: Not on file    Gets together: Not on file    Attends religious service: Not on file    Active member of  club or organization: Not on file    Attends meetings of clubs or organizations: Not on file    Relationship status: Not on file  Other Topics Concern  . Not on file  Social History Narrative  . Not on file    Family History: Family History  Problem Relation Age of Onset  . Hypertension Mother     Allergies: No Known Allergies  Medications Prior to Admission  Medication Sig Dispense Refill Last Dose  . Elastic Bandages & Supports (COMFORT FIT MATERNITY SUPP LG) MISC 1 each by Does not apply route daily as needed. 1 each 0   . Prenatal Vit w/Fe-Methylfol-FA (PNV PO) Take 1 tablet by mouth daily.    07/03/2018 at Unknown time     Review of Systems  All systems reviewed and negative except as stated in HPI  Physical Exam Blood pressure 122/77, pulse 77, temperature 98 F (36.7 C), temperature source Oral, resp. rate 15, last  menstrual period 02/18/2018. General appearance: alert, oriented, NAD Lungs: normal respiratory effort Heart: regular rate Abdomen: soft, non-tender; gravid, FH appropriate for GA Extremities: No calf swelling or tenderness Presentation: cephalic Fetal monitoring: baseline 145 bpm, moderate variability, + acels, variable decels Uterine activity: ctx q 10 mins  Cervical Exam: 1/Thick/-2  Prenatal labs: ABO, Rh:  O+ Antibody:  Negative  Rubella:  Immune  RPR:   Non-reactive  HBsAg:   Negative  HIV:   Non-reactive  GC/Chlamydia: Negative  GBS:   Positive  1-hr GTT: 111 Genetic screening:  Negative  Anatomy US: Normal   Prenatal Transfer Tool  Maternal Diabetes: No Genetic Screening: Normal Maternal Ultrasounds/Referrals: Normal Fetal Ultrasounds or other Referrals:  None Maternal Substance Abuse:  Yes:  Type: Smoker, Marijuana Significant Maternal Medications:  None Significant Maternal Lab Results: Lab values include: Group B Strep positive  No results found for this or any previous visit (from the past 24 hour(s)).  Patient Active  Problem List   Diagnosis Date Noted  . Oligohydramnios 12/05/2018  . Depression 12/10/2012    Assessment: Brenda Simon is a 28 y.o. G1P0 at [redacted]w[redacted]d here for IOL for oligohydramnios.   #Labor: Cytotec x 1.  #Pain: IV pain meds/Epidural upon request #FWB: Cat I #ID:  GBS positive - start PCN #MOF: Breast #MOC: Unsure #Circ:  N/A  Kiersten P Mullis 12/05/2018, 2:51 PM   OB FELLOW HISTORY AND PHYSICAL ATTESTATION  I have seen and examined this patient; I agree with above documentation in the resident's note.   Marcy Siren, D.O. OB Fellow  12/05/2018, 3:26 PM

## 2018-12-05 NOTE — Progress Notes (Signed)
LABOR PROGRESS NOTE  Brenda Simon is a 28 y.o. G1P0 at [redacted]w[redacted]d  admitted for  IOL for oligohydramnios.   Subjective: Patient currently comfortable. No concerns or complaints. Tolerating contractions well.  Objective: BP 122/73   Pulse 94   Temp (!) 97.5 F (36.4 C) (Axillary)   Resp 16   Ht 5\' 4"  (1.626 m)   Wt 81.2 kg   LMP 02/18/2018 (Exact Date)   BMI 30.73 kg/m  or  Vitals:   12/05/18 1407 12/05/18 1600 12/05/18 1804  BP: 122/77 118/70 122/73  Pulse: 77 80 94  Resp: 15 15 16   Temp: 98 F (36.7 C)  (!) 97.5 F (36.4 C)  TempSrc: Oral  Axillary  Weight: 81.2 kg    Height: 5\' 4"  (1.626 m)     Dilation: 1.5 Effacement (%): Thick Cervical Position: Middle Station: -1 Presentation: Vertex Exam by:: Dr. Mauri Reading FHT: baseline rate 150, moderate varibility, + acel, no decel Toco: ctx q2-5 mins  Labs: Lab Results  Component Value Date   WBC 7.3 12/05/2018   HGB 11.7 (L) 12/05/2018   HCT 36.3 12/05/2018   MCV 80.7 12/05/2018   PLT 277 12/05/2018    Patient Active Problem List   Diagnosis Date Noted  . Oligohydramnios 12/05/2018  . Depression 12/10/2012    Assessment / Plan: 28 y.o. G1P0 at [redacted]w[redacted]d here for  IOL for oligohydramnios.   Labor: Attempted foley bulb without success - cervix posterior making placement very difficult. Cytotec x 2 given at 1922. Fetal Wellbeing:  Cat I Pain Control:  IV Pain meds/Epidural upon maternal request Anticipated MOD:  SVD  Orpah Cobb, D.O. [facility], PGY1 12/05/2018, 7:45 PM

## 2018-12-05 NOTE — Progress Notes (Signed)
LABOR PROGRESS NOTE  Brenda Simon is a 28 y.o. G1P0 at [redacted]w[redacted]d admitted for IOL for oligohydramnios   Subjective: Patient doing well, IV Fentanyl given prior to FB reattempt at insertion    Objective: BP 122/73   Pulse 94   Temp (!) 97.5 F (36.4 C) (Axillary)   Resp 16   Ht 5\' 4"  (1.626 m)   Wt 81.2 kg   LMP 02/18/2018 (Exact Date)   BMI 30.73 kg/m  or  Vitals:   12/05/18 1407 12/05/18 1600 12/05/18 1804  BP: 122/77 118/70 122/73  Pulse: 77 80 94  Resp: 15 15 16   Temp: 98 F (36.7 C)  (!) 97.5 F (36.4 C)  TempSrc: Oral  Axillary  Weight: 81.2 kg    Height: 5\' 4"  (1.626 m)      FB placed @ 2120  Dilation: 2 Effacement (%): 60 Cervical Position: Posterior Station: -2 Presentation: Vertex Exam by:: Lanice Shirts CNM FHT: baseline rate 150, moderate varibility, +accel, no decel Toco: 3-7  Labs: Lab Results  Component Value Date   WBC 7.3 12/05/2018   HGB 11.7 (L) 12/05/2018   HCT 36.3 12/05/2018   MCV 80.7 12/05/2018   PLT 277 12/05/2018    Patient Active Problem List   Diagnosis Date Noted  . Oligohydramnios 12/05/2018  . Depression 12/10/2012    Assessment / Plan: 28 y.o. G1P0 at [redacted]w[redacted]d here for IOL for oligo   Labor: FB inserted, last cytotec around 1900, will start pitocin at 2300 Fetal Wellbeing:  Cat I  Pain Control:  Pain medication ordered PRN  Anticipated MOD:  SVD  Sharyon Cable, CNM 12/05/2018, 9:25 PM

## 2018-12-06 ENCOUNTER — Inpatient Hospital Stay (HOSPITAL_COMMUNITY): Payer: Medicaid Other | Admitting: Anesthesiology

## 2018-12-06 ENCOUNTER — Encounter (HOSPITAL_COMMUNITY)
Admission: AD | Disposition: A | Discharge status: Home / Self Care | Payer: Self-pay | Source: Home / Self Care | Attending: Family Medicine

## 2018-12-06 ENCOUNTER — Encounter (HOSPITAL_COMMUNITY): Payer: Self-pay | Admitting: *Deleted

## 2018-12-06 DIAGNOSIS — O99824 Streptococcus B carrier state complicating childbirth: Secondary | ICD-10-CM

## 2018-12-06 DIAGNOSIS — O480 Post-term pregnancy: Secondary | ICD-10-CM

## 2018-12-06 DIAGNOSIS — Z3A40 40 weeks gestation of pregnancy: Secondary | ICD-10-CM

## 2018-12-06 LAB — RPR: RPR: NONREACTIVE

## 2018-12-06 SURGERY — Surgical Case
Anesthesia: Epidural | Site: Abdomen | Wound class: Clean Contaminated

## 2018-12-06 MED ORDER — GABAPENTIN 100 MG PO CAPS
100.0000 mg | ORAL_CAPSULE | Freq: Two times a day (BID) | ORAL | Status: DC
  Administered 2018-12-06 – 2018-12-09 (×6): 100 mg via ORAL
  Filled 2018-12-06 (×7): qty 1

## 2018-12-06 MED ORDER — PRENATAL MULTIVITAMIN CH
1.0000 | ORAL_TABLET | Freq: Every day | ORAL | Status: DC
  Administered 2018-12-07 – 2018-12-09 (×3): 1 via ORAL
  Filled 2018-12-06 (×3): qty 1

## 2018-12-06 MED ORDER — KETOROLAC TROMETHAMINE 30 MG/ML IJ SOLN
INTRAMUSCULAR | Status: AC
  Filled 2018-12-06: qty 1

## 2018-12-06 MED ORDER — MENTHOL 3 MG MT LOZG: 1.0000 | LOZENGE | OROMUCOSAL | Status: DC | PRN

## 2018-12-06 MED ORDER — SIMETHICONE 80 MG PO CHEW
80.0000 mg | CHEWABLE_TABLET | Freq: Three times a day (TID) | ORAL | Status: DC
  Administered 2018-12-07 – 2018-12-08 (×6): 80 mg via ORAL
  Filled 2018-12-06 (×7): qty 1

## 2018-12-06 MED ORDER — WITCH HAZEL-GLYCERIN EX PADS: 1.0000 "application " | MEDICATED_PAD | CUTANEOUS | Status: DC | PRN

## 2018-12-06 MED ORDER — LACTATED RINGERS IV SOLN
INTRAVENOUS | Status: DC
  Administered 2018-12-06: 22:00:00 via INTRAVENOUS

## 2018-12-06 MED ORDER — LIDOCAINE-EPINEPHRINE (PF) 2 %-1:200000 IJ SOLN
INTRAMUSCULAR | Status: DC | PRN
  Administered 2018-12-06 (×3): 5 mL via EPIDURAL

## 2018-12-06 MED ORDER — DEXAMETHASONE SODIUM PHOSPHATE 4 MG/ML IJ SOLN
INTRAMUSCULAR | Status: AC
  Filled 2018-12-06: qty 1

## 2018-12-06 MED ORDER — OXYTOCIN 40 UNITS IN NORMAL SALINE INFUSION - SIMPLE MED: 2.5000 [IU]/h | INTRAVENOUS | Status: AC

## 2018-12-06 MED ORDER — MORPHINE SULFATE (PF) 0.5 MG/ML IJ SOLN
INTRAMUSCULAR | Status: DC | PRN
  Administered 2018-12-06: 3 mg via EPIDURAL

## 2018-12-06 MED ORDER — METOCLOPRAMIDE HCL 5 MG/ML IJ SOLN
INTRAMUSCULAR | Status: DC | PRN
  Administered 2018-12-06: 10 mg via INTRAVENOUS

## 2018-12-06 MED ORDER — MORPHINE SULFATE (PF) 0.5 MG/ML IJ SOLN
INTRAMUSCULAR | Status: AC
  Filled 2018-12-06: qty 10

## 2018-12-06 MED ORDER — SODIUM CHLORIDE 0.9 % IV SOLN
500.0000 mg | Freq: Once | INTRAVENOUS | Status: AC
  Administered 2018-12-06: 500 mg via INTRAVENOUS
  Filled 2018-12-06: qty 500

## 2018-12-06 MED ORDER — SODIUM CHLORIDE 0.9 % IR SOLN
Status: DC | PRN
  Administered 2018-12-06: 1

## 2018-12-06 MED ORDER — LACTATED RINGERS IV SOLN
INTRAVENOUS | Status: DC | PRN
  Administered 2018-12-06: 15:00:00 via INTRAVENOUS

## 2018-12-06 MED ORDER — DEXAMETHASONE SODIUM PHOSPHATE 4 MG/ML IJ SOLN
INTRAMUSCULAR | Status: DC | PRN
  Administered 2018-12-06: 4 mg via INTRAVENOUS

## 2018-12-06 MED ORDER — ZOLPIDEM TARTRATE 5 MG PO TABS: 5.0000 mg | ORAL_TABLET | Freq: Every evening | ORAL | Status: DC | PRN

## 2018-12-06 MED ORDER — OXYCODONE HCL 5 MG PO TABS
5.0000 mg | ORAL_TABLET | ORAL | Status: DC | PRN
  Administered 2018-12-07 – 2018-12-08 (×4): 5 mg via ORAL
  Filled 2018-12-06 (×4): qty 1

## 2018-12-06 MED ORDER — DIBUCAINE 1 % RE OINT: 1.0000 "application " | TOPICAL_OINTMENT | RECTAL | Status: DC | PRN

## 2018-12-06 MED ORDER — DIPHENHYDRAMINE HCL 25 MG PO CAPS
25.0000 mg | ORAL_CAPSULE | Freq: Four times a day (QID) | ORAL | Status: DC | PRN
  Administered 2018-12-06: 25 mg via ORAL
  Filled 2018-12-06: qty 1

## 2018-12-06 MED ORDER — STERILE WATER FOR IRRIGATION IR SOLN
Status: DC | PRN
  Administered 2018-12-06: 1

## 2018-12-06 MED ORDER — SODIUM CHLORIDE 0.9 % IV SOLN
INTRAVENOUS | Status: AC
  Filled 2018-12-06: qty 500

## 2018-12-06 MED ORDER — OXYTOCIN 40 UNITS IN NORMAL SALINE INFUSION - SIMPLE MED
1.0000 m[IU]/min | INTRAVENOUS | Status: DC
  Administered 2018-12-06: 8 m[IU]/min via INTRAVENOUS
  Administered 2018-12-06: 2 m[IU]/min via INTRAVENOUS
  Filled 2018-12-06: qty 1000

## 2018-12-06 MED ORDER — IBUPROFEN 800 MG PO TABS: 800.0000 mg | ORAL_TABLET | Freq: Four times a day (QID) | ORAL | Status: DC

## 2018-12-06 MED ORDER — COCONUT OIL OIL: 1.0000 "application " | TOPICAL_OIL | Status: DC | PRN

## 2018-12-06 MED ORDER — OXYTOCIN 40 UNITS IN NORMAL SALINE INFUSION - SIMPLE MED
INTRAVENOUS | Status: AC
  Filled 2018-12-06: qty 1000

## 2018-12-06 MED ORDER — SIMETHICONE 80 MG PO CHEW
80.0000 mg | CHEWABLE_TABLET | ORAL | Status: DC
  Administered 2018-12-06 – 2018-12-08 (×3): 80 mg via ORAL
  Filled 2018-12-06 (×3): qty 1

## 2018-12-06 MED ORDER — OXYTOCIN 10 UNIT/ML IJ SOLN
INTRAVENOUS | Status: DC | PRN
  Administered 2018-12-06: 40 [IU] via INTRAVENOUS

## 2018-12-06 MED ORDER — SODIUM CHLORIDE (PF) 0.9 % IJ SOLN
INTRAMUSCULAR | Status: DC | PRN
  Administered 2018-12-06: 12 mL/h via EPIDURAL

## 2018-12-06 MED ORDER — ENOXAPARIN SODIUM 40 MG/0.4ML ~~LOC~~ SOLN
40.0000 mg | SUBCUTANEOUS | Status: DC
  Administered 2018-12-07 – 2018-12-08 (×2): 40 mg via SUBCUTANEOUS
  Filled 2018-12-06 (×2): qty 0.4

## 2018-12-06 MED ORDER — KETOROLAC TROMETHAMINE 30 MG/ML IJ SOLN
30.0000 mg | Freq: Four times a day (QID) | INTRAMUSCULAR | Status: AC
  Administered 2018-12-06 – 2018-12-07 (×3): 30 mg via INTRAVENOUS
  Filled 2018-12-06 (×3): qty 1

## 2018-12-06 MED ORDER — CEFAZOLIN SODIUM-DEXTROSE 2-4 GM/100ML-% IV SOLN
INTRAVENOUS | Status: AC
  Filled 2018-12-06: qty 100

## 2018-12-06 MED ORDER — MEASLES, MUMPS & RUBELLA VAC IJ SOLR: 0.5000 mL | Freq: Once | INTRAMUSCULAR | Status: DC

## 2018-12-06 MED ORDER — SIMETHICONE 80 MG PO CHEW
80.0000 mg | CHEWABLE_TABLET | ORAL | Status: DC | PRN
  Administered 2018-12-09: 80 mg via ORAL

## 2018-12-06 MED ORDER — LIDOCAINE-EPINEPHRINE (PF) 2 %-1:200000 IJ SOLN
INTRAMUSCULAR | Status: AC
  Filled 2018-12-06: qty 20

## 2018-12-06 MED ORDER — TETANUS-DIPHTH-ACELL PERTUSSIS 5-2.5-18.5 LF-MCG/0.5 IM SUSP: 0.5000 mL | Freq: Once | INTRAMUSCULAR | Status: DC

## 2018-12-06 MED ORDER — CEFAZOLIN SODIUM-DEXTROSE 2-3 GM-%(50ML) IV SOLR
INTRAVENOUS | Status: DC | PRN
  Administered 2018-12-06: 2 g via INTRAVENOUS

## 2018-12-06 MED ORDER — LACTATED RINGERS AMNIOINFUSION
INTRAVENOUS | Status: DC
  Administered 2018-12-06: 10:00:00 via INTRAUTERINE

## 2018-12-06 MED ORDER — SENNOSIDES-DOCUSATE SODIUM 8.6-50 MG PO TABS
2.0000 | ORAL_TABLET | ORAL | Status: DC
  Administered 2018-12-06 – 2018-12-08 (×3): 2 via ORAL
  Filled 2018-12-06 (×3): qty 2

## 2018-12-06 MED ORDER — KETOROLAC TROMETHAMINE 30 MG/ML IJ SOLN
30.0000 mg | Freq: Four times a day (QID) | INTRAMUSCULAR | Status: DC
  Administered 2018-12-06: 30 mg via INTRAVENOUS

## 2018-12-06 MED ORDER — ONDANSETRON HCL 4 MG/2ML IJ SOLN
INTRAMUSCULAR | Status: AC
  Filled 2018-12-06: qty 2

## 2018-12-06 MED ORDER — IBUPROFEN 800 MG PO TABS
800.0000 mg | ORAL_TABLET | Freq: Four times a day (QID) | ORAL | Status: DC
  Administered 2018-12-07 – 2018-12-09 (×8): 800 mg via ORAL
  Filled 2018-12-06 (×8): qty 1

## 2018-12-06 SURGICAL SUPPLY — 33 items
APL SKNCLS STERI-STRIP NONHPOA (GAUZE/BANDAGES/DRESSINGS) ×1
BENZOIN TINCTURE PRP APPL 2/3 (GAUZE/BANDAGES/DRESSINGS) ×2 IMPLANT
CHLORAPREP W/TINT 26ML (MISCELLANEOUS) ×2 IMPLANT
CLAMP CORD UMBIL (MISCELLANEOUS) IMPLANT
CLOSURE STERI STRIP 1/2 X4 (GAUZE/BANDAGES/DRESSINGS) ×1 IMPLANT
CLOTH BEACON ORANGE TIMEOUT ST (SAFETY) ×2 IMPLANT
DRSG OPSITE POSTOP 4X10 (GAUZE/BANDAGES/DRESSINGS) ×2 IMPLANT
ELECT REM PT RETURN 9FT ADLT (ELECTROSURGICAL) ×2
ELECTRODE REM PT RTRN 9FT ADLT (ELECTROSURGICAL) ×1 IMPLANT
EXTRACTOR VACUUM M CUP 4 TUBE (SUCTIONS) IMPLANT
GLOVE BIOGEL PI IND STRL 7.0 (GLOVE) ×2 IMPLANT
GLOVE BIOGEL PI IND STRL 7.5 (GLOVE) ×2 IMPLANT
GLOVE BIOGEL PI INDICATOR 7.0 (GLOVE) ×2
GLOVE BIOGEL PI INDICATOR 7.5 (GLOVE) ×2
GLOVE ECLIPSE 7.5 STRL STRAW (GLOVE) ×2 IMPLANT
GOWN STRL REUS W/TWL LRG LVL3 (GOWN DISPOSABLE) ×6 IMPLANT
KIT ABG SYR 3ML LUER SLIP (SYRINGE) IMPLANT
NDL HYPO 25X5/8 SAFETYGLIDE (NEEDLE) IMPLANT
NEEDLE HYPO 25X5/8 SAFETYGLIDE (NEEDLE) IMPLANT
NS IRRIG 1000ML POUR BTL (IV SOLUTION) ×2 IMPLANT
PACK C SECTION WH (CUSTOM PROCEDURE TRAY) ×2 IMPLANT
PAD OB MATERNITY 4.3X12.25 (PERSONAL CARE ITEMS) ×2 IMPLANT
PENCIL SMOKE EVAC W/HOLSTER (ELECTROSURGICAL) ×2 IMPLANT
RTRCTR C-SECT PINK 25CM LRG (MISCELLANEOUS) ×2 IMPLANT
STRIP CLOSURE SKIN 1/2X4 (GAUZE/BANDAGES/DRESSINGS) ×2 IMPLANT
SUT VIC AB 0 CTX 36 (SUTURE) ×8
SUT VIC AB 0 CTX36XBRD ANBCTRL (SUTURE) ×3 IMPLANT
SUT VIC AB 2-0 CT1 27 (SUTURE) ×2
SUT VIC AB 2-0 CT1 TAPERPNT 27 (SUTURE) ×1 IMPLANT
SUT VIC AB 4-0 KS 27 (SUTURE) ×2 IMPLANT
TOWEL OR 17X24 6PK STRL BLUE (TOWEL DISPOSABLE) ×2 IMPLANT
TRAY FOLEY W/BAG SLVR 14FR LF (SET/KITS/TRAYS/PACK) ×2 IMPLANT
WATER STERILE IRR 1000ML POUR (IV SOLUTION) ×2 IMPLANT

## 2018-12-06 NOTE — Anesthesia Pain Management Evaluation Note (Signed)
  [facility] Pain Management Visit Note  Patient: Brenda Simon, 28 y.o., female  "Hello I am a member of the anesthesia team at [facility]. We have an anesthesia team available at all times to provide care throughout the hospital, including epidural management and anesthesia for C-section. I don't know your plan for the delivery whether it a natural birth, water birth, IV sedation, nitrous supplementation, doula or epidural, but we want to meet your pain goals."   1.Was your pain managed to your expectations on prior hospitalizations?   No prior hospitalizations  2.What is your expectation for pain management during this hospitalization?     Epidural  3.How can we help you reach that goal? Epidural at pain goal.  Record the patient's initial score and the patient's pain goal.   Pain: 0  Pain Goal: 8 [facility] wants you to be able to say your pain was always managed very well.  Brenda Simon 12/06/2018

## 2018-12-06 NOTE — Progress Notes (Signed)
LABOR PROGRESS NOTE  Brenda Simon is a 28 y.o. G1P0 at [redacted]w[redacted]d  admitted for IOL for oligo  Subjective: Patient doing well, reports contractions and cramping are increasing. Fentanyl recently given   Objective: BP 118/76   Pulse 71   Temp 98.9 F (37.2 C) (Oral)   Resp 17   Ht 5\' 4"  (1.626 m)   Wt 81.2 kg   LMP 02/18/2018 (Exact Date)   BMI 30.73 kg/m  or  Vitals:   12/06/18 0158 12/06/18 0400 12/06/18 0412 12/06/18 0505  BP: 123/80 113/70  118/76  Pulse: 78 72  71  Resp: 17   17  Temp: 98.5 F (36.9 C)  98.8 F (37.1 C) 98.9 F (37.2 C)  TempSrc: Oral  Oral Oral  Weight:      Height:        FB out @ 0550, continue titration of pitocin  Dilation: 4 Effacement (%): 60 Cervical Position: Posterior Station: -3 Presentation: Vertex Exam by:: Karl Ito FHT: baseline rate 145, moderate varibility, +accel, early decel Toco: occasional mild contractions   Labs: Lab Results  Component Value Date   WBC 7.3 12/05/2018   HGB 11.7 (L) 12/05/2018   HCT 36.3 12/05/2018   MCV 80.7 12/05/2018   PLT 277 12/05/2018    Patient Active Problem List   Diagnosis Date Noted  . Oligohydramnios 12/05/2018  . Depression 12/10/2012    Assessment / Plan: 28 y.o. G1P0 at [redacted]w[redacted]d here for IOL for oligo   Labor: Continue pitocin titration  Fetal Wellbeing:  Cat I  Pain Control:  IV Fentanyl  Anticipated MOD:  SVD  Sharyon Cable, CNM 12/06/2018, 6:57 AM

## 2018-12-06 NOTE — Progress Notes (Addendum)
LABOR PROGRESS NOTE  Brenda Simon is a 28 y.o. G1P0 at [redacted]w[redacted]d  admitted for IOL for oligo.  Subjective: Patient notes doing well. More comfortable since Epidural placed. Feeling fetal movement but no contractions. Foley catheter being placed at time of exam.  Objective: BP 120/63   Pulse 74   Temp 97.7 F (36.5 C) (Axillary)   Resp 18   Ht 5\' 4"  (1.626 m)   Wt 81.2 kg   LMP 02/18/2018 (Exact Date)   SpO2 90%   BMI 30.73 kg/m  or  Vitals:   12/06/18 0930 12/06/18 0932 12/06/18 0936 12/06/18 0941  BP:  (!) 132/50 (!) 103/41 120/63  Pulse:  80 86 74  Resp:      Temp:      TempSrc:      SpO2: 90%     Weight:      Height:        Dilation: 5.5 Effacement (%): 80 Cervical Position: Middle Station: -2 Presentation: Vertex Exam by:: sowder FHT: baseline rate ~140, moderate varibility, + acel, prolonged decel x 1 when patient lying on back, returned to baseline upon repositioning Toco: variable  Labs: Lab Results  Component Value Date   WBC 7.3 12/05/2018   HGB 11.7 (L) 12/05/2018   HCT 36.3 12/05/2018   MCV 80.7 12/05/2018   PLT 277 12/05/2018    Patient Active Problem List   Diagnosis Date Noted  . Oligohydramnios 12/05/2018  . Depression 12/10/2012    Assessment / Plan: 28 y.o. G1P0 at [redacted]w[redacted]d here for IOL for Oligo  Labor: SROM at [phone] with clear fluid. Progressing well. Continue Pit with titration as needed. Fetal Wellbeing:  Cat II tracing - Prolonged decel in association with position that returned to baseline with repositioning. Continue to monitor closely. Pain Control:  Epidural in place Anticipated MOD:  SVD  Orpah Cobb, D.O. [facility]. PGY1 12/06/2018, 9:50 AM

## 2018-12-06 NOTE — Progress Notes (Signed)
Patient ID: Brenda Simon, female   DOB: March 19, 1991, 28 y.o.   MRN: [id number]  Patient seen - Cat 2 tracing. Minimal variability with late decel with every contraction. No cervical change. Discussed primary cesarean delivery for fetal indications.   The risks of cesarean section discussed with the patient included but were not limited to: bleeding which may require transfusion or reoperation; infection which may require antibiotics; injury to bowel, bladder, ureters or other surrounding organs; injury to the fetus; need for additional procedures including hysterectomy in the event of a life-threatening hemorrhage; placental abnormalities wth subsequent pregnancies, incisional problems, thromboembolic phenomenon and other postoperative/anesthesia complications. The patient concurred with the proposed plan, giving informed written consent for the procedure.   Patient has been NPO since this AM. She will remain NPO for procedure. Anesthesia and OR aware.  Preoperative prophylactic Ancef ordered on call to the OR.  To OR when ready.  Levie Heritage, DO 12/06/2018 1:51 PM

## 2018-12-06 NOTE — Op Note (Signed)
Cesarean Section Operative Report  PATIENT: Brenda Simon  PROCEDURE DATE: 12/06/2018  PREOPERATIVE DIAGNOSES: Intrauterine pregnancy at [redacted]w[redacted]d weeks gestation; non-reassuring fetal status  POSTOPERATIVE DIAGNOSES: The same  PROCEDURE: Primary Low Transverse Cesarean Section  SURGEON:   Surgeon(s) and Role:    * Levie Heritage, DO - Primary    * Arvilla Market, DO - OB Fellow   INDICATIONS: Brenda Simon is a 28 y.o. G1P0 at [redacted]w[redacted]d here for cesarean section secondary to the indications listed under preoperative diagnoses; please see preoperative note for further details.  The risks of cesarean section were discussed with the patient including but were not limited to: bleeding which may require transfusion or reoperation; infection which may require antibiotics; injury to bowel, bladder, ureters or other surrounding organs; injury to the fetus; need for additional procedures including hysterectomy in the event of a life-threatening hemorrhage; placental abnormalities wth subsequent pregnancies, incisional problems, thromboembolic phenomenon and other postoperative/anesthesia complications.   The patient concurred with the proposed plan, giving informed written consent for the procedure.    FINDINGS:  Viable female infant in cephalic presentation, direct OP.  Apgars 9 and 9.  Clear amniotic fluid.  Intact placenta, three vessel cord.  Normal uterus, fallopian tubes and ovaries bilaterally.  ANESTHESIA: Epidural INTRAVENOUS FLUIDS: 1000 mL  ESTIMATED BLOOD LOSS: 136 mL URINE OUTPUT:  1450 ml SPECIMENS: Placenta sent to L&D COMPLICATIONS: None immediate  PROCEDURE IN DETAIL:  The patient preoperatively received intravenous antibiotics and had sequential compression devices applied to her lower extremities.  She was then taken to the operating room where the epidural anesthesia was dosed up to surgical level and was found to be adequate. She was then placed in a dorsal supine  position with a leftward tilt, and prepped and draped in a sterile manner.  A foley catheter was placed into her bladder and attached to constant gravity.    After an adequate timeout was performed, a Pfannenstiel skin incision was made with scalpel and carried through to the underlying layer of fascia. The fascia was incised in the midline, and this incision was extended bilaterally using the Mayo scissors.  Kocher clamps were applied to the superior aspect of the fascial incision and the underlying rectus muscles were dissected off bluntly.  A similar process was carried out on the inferior aspect of the fascial incision. The rectus muscles were separated in the midline bluntly and the peritoneum was entered bluntly. Attention was turned to the lower uterine segment where a low transverse hysterotomy was made with a scalpel and extended bilaterally bluntly.  The infant was successfully delivered, the cord was clamped and cut after one minute, and the infant was handed over to the awaiting neonatology team. Uterine massage was then administered, and the placenta delivered intact with a three-vessel cord. The uterus was then cleared of clots and debris.  The hysterotomy was closed with 0 Vicryl in a running locked fashion, and an imbricating layer was also placed with 0 Vicryl.  Figure-of-eight 0 Vicryl serosal stitches were placed to help with hemostasis.  The pelvis was cleared of all clot and debris. Hemostasis was confirmed on all surfaces.  The peritoneum was closed with a 0 Vicryl running stitch. The fascia was then closed using 0 Vicryl in a running fashion.  The subcutaneous layer was irrigated.  The skin was closed with a 4-0 Vicryl subcuticular stitch.   The patient tolerated the procedure well. Sponge, lap, instrument and needle counts were correct x 3.  She was taken to the recovery room in stable condition.   An experienced assistant was required given the standard of surgical care given the  complexity of the case.  This assistant was needed for exposure, dissection, suctioning, retraction, instrument exchange, assisting with delivery with administration of fundal pressure, and for overall help during the procedure.   Maternal Disposition: PACU - hemodynamically stable.   Infant Disposition: stable   Marcy Siren, D.O. OB Fellow  12/06/2018, 6:04 PM

## 2018-12-06 NOTE — Anesthesia Postprocedure Evaluation (Signed)
Anesthesia Post Note  Patient: Brenda Simon  Procedure(s) Performed: CESAREAN SECTION (N/A Abdomen)     Anesthesia Type: Epidural    Last Vitals:  Vitals:   12/06/18 1645 12/06/18 1700  BP: 101/70 108/69  Pulse: 83 80  Resp: 16 18  Temp:  37 C  SpO2: 100% 99%    Last Pain:  Vitals:   12/06/18 1700  TempSrc: Oral  PainSc: 0-No pain   Pain Goal:                   Merrianne Mccumbers

## 2018-12-06 NOTE — Progress Notes (Signed)
Brenda Simon is a 28 y.o. G1P0 at [redacted]w[redacted]d.  Subjective: Patient awake sitting up comfortably in bed with some mild back pain. Otherwise, she is doing well.  Objective: BP 135/76   Pulse 85   Temp 98 F (36.7 C) (Oral)   Resp 17   Ht 5\' 4"  (1.626 m)   Wt 81.2 kg   LMP 02/18/2018 (Exact Date)   BMI 30.73 kg/m     Dilation: 2 Effacement (%): 60 Cervical Position: Posterior Station: -2 Presentation: Vertex Exam by:: V Rogers CNM  Labs: Results for orders placed or performed during the hospital encounter of 12/05/18 (from the past 24 hour(s))  CBC     Status: Abnormal   Collection Time: 12/05/18  2:05 PM  Result Value Ref Range   WBC 7.3 4.0 - 10.5 K/uL   RBC 4.50 3.87 - 5.11 MIL/uL   Hemoglobin 11.7 (L) 12.0 - 15.0 g/dL   HCT 78.6 75.4 - 49.2 %   MCV 80.7 80.0 - 100.0 fL   MCH 26.0 26.0 - 34.0 pg   MCHC 32.2 30.0 - 36.0 g/dL   RDW 01.0 (H) 07.1 - 21.9 %   Platelets 277 150 - 400 K/uL   nRBC 0.0 0.0 - 0.2 %  Type and screen [facility]     Status: None   Collection Time: 12/05/18  2:05 PM  Result Value Ref Range   ABO/RH(D) O POS    Antibody Screen NEG    Sample Expiration      12/08/2018 Performed at [facility], [address] [place], [place] [address]   ABO/Rh     Status: None   Collection Time: 12/05/18  2:05 PM  Result Value Ref Range   ABO/RH(D)      O POS Performed at [facility], [address] [place], [place] [address]   Urine rapid drug screen (hosp performed)     Status: None   Collection Time: 12/05/18  7:22 PM  Result Value Ref Range   Opiates NONE DETECTED NONE DETECTED   Cocaine NONE DETECTED NONE DETECTED   Benzodiazepines NONE DETECTED NONE DETECTED   Amphetamines NONE DETECTED NONE DETECTED   Tetrahydrocannabinol NONE DETECTED NONE DETECTED   Barbiturates NONE DETECTED NONE DETECTED    Assessment / Plan: [redacted]w[redacted]d week IUP admitted for IOL due to oligohydramnios Labor: Cytotec x 2; folely bulb in place,  progressing Fetal Wellbeing:  Category 1 Pain Control:  Analgesics and anethesia prn; epidural on request Anticipated MOD:  SVD  Arlyce Harman, DO  [facility], PGY-2 12/06/2018 12:06 AM

## 2018-12-06 NOTE — Anesthesia Preprocedure Evaluation (Signed)
Anesthesia Evaluation  Patient identified by MRN, date of birth, ID band Patient awake    Reviewed: Allergy & Precautions, H&P , NPO status , Patient's Chart, lab work & pertinent test results  History of Anesthesia Complications Negative for: history of anesthetic complications  Airway Mallampati: II  TM Distance: >3 FB Neck ROM: full    Dental no notable dental hx.    Pulmonary neg pulmonary ROS, former smoker,    Pulmonary exam normal        Cardiovascular negative cardio ROS Normal cardiovascular exam Rhythm:regular Rate:Normal     Neuro/Psych negative neurological ROS  negative psych ROS   GI/Hepatic negative GI ROS, Neg liver ROS,   Endo/Other  negative endocrine ROS  Renal/GU negative Renal ROS  negative genitourinary   Musculoskeletal   Abdominal   Peds  Hematology negative hematology ROS (+)   Anesthesia Other Findings   Reproductive/Obstetrics (+) Pregnancy                             Anesthesia Physical Anesthesia Plan  ASA: II  Anesthesia Plan: Epidural   Post-op Pain Management:    Induction:   PONV Risk Score and Plan:   Airway Management Planned:   Additional Equipment:   Intra-op Plan:   Post-operative Plan:   Informed Consent: I have reviewed the patients History and Physical, chart, labs and discussed the procedure including the risks, benefits and alternatives for the proposed anesthesia with the patient or authorized representative who has indicated his/her understanding and acceptance.       Plan Discussed with:   Anesthesia Plan Comments:         Anesthesia Quick Evaluation  

## 2018-12-06 NOTE — Transfer of Care (Signed)
Immediate Anesthesia Transfer of Care Note  Patient: Brenda Simon  Procedure(s) Performed: CESAREAN SECTION (N/A Abdomen)  Patient Location: PACU  Anesthesia Type:Epidural  Level of Consciousness: awake, alert  and oriented  Airway & Oxygen Therapy: Patient Spontanous Breathing  Post-op Assessment: Report given to RN and Post -op Vital signs reviewed and stable  Post vital signs: Reviewed and stable  Last Vitals:  Vitals Value Taken Time  BP 114/68 12/06/2018  3:45 PM  Temp 36.9 C 12/06/2018  3:45 PM  Pulse 87 12/06/2018  3:56 PM  Resp 16 12/06/2018  3:56 PM  SpO2 96 % 12/06/2018  3:56 PM  Vitals shown include unvalidated device data.  Last Pain:  Vitals:   12/06/18 1545  TempSrc: Oral  PainSc: 0-No pain         Complications: No apparent anesthesia complications

## 2018-12-06 NOTE — Anesthesia Procedure Notes (Signed)
Epidural Patient location during procedure: OB Start time: 12/06/2018 8:54 AM End time: 12/06/2018 9:09 AM  Staffing Anesthesiologist: Lucretia Kern, MD Performed: anesthesiologist   Preanesthetic Checklist Completed: patient identified, pre-op evaluation, timeout performed, IV checked, risks and benefits discussed and monitors and equipment checked  Epidural Patient position: sitting Prep: DuraPrep Patient monitoring: heart rate, continuous pulse ox and blood pressure Approach: midline Location: L3-L4 Injection technique: LOR air  Needle:  Needle type: Tuohy  Needle gauge: 17 G Needle length: 9 cm Needle insertion depth: 7 cm Catheter type: closed end flexible Catheter size: 19 Gauge Catheter at skin depth: 12 cm Test dose: 2% lidocaine with Epi 1:200 K and negative  Assessment Events: blood not aspirated, injection not painful, no injection resistance, negative IV test and no paresthesia  Additional Notes Reason for block:procedure for pain

## 2018-12-07 LAB — BIRTH TISSUE RECOVERY COLLECTION (PLACENTA DONATION)

## 2018-12-07 LAB — CBC
HCT: 27.4 % — ABNORMAL LOW (ref 36.0–46.0)
Hemoglobin: 8.9 g/dL — ABNORMAL LOW (ref 12.0–15.0)
MCH: 26.6 pg (ref 26.0–34.0)
MCHC: 32.5 g/dL (ref 30.0–36.0)
MCV: 81.8 fL (ref 80.0–100.0)
NRBC: 0 % (ref 0.0–0.2)
Platelets: 213 10*3/uL (ref 150–400)
RBC: 3.35 MIL/uL — ABNORMAL LOW (ref 3.87–5.11)
RDW: 15.6 % — AB (ref 11.5–15.5)
WBC: 14.3 10*3/uL — ABNORMAL HIGH (ref 4.0–10.5)

## 2018-12-07 LAB — CREATININE, SERUM
Creatinine, Ser: 0.67 mg/dL (ref 0.44–1.00)
GFR calc Af Amer: >60 mL/min (ref >60)
GFR calc non Af Amer: >60 mL/min (ref >60)

## 2018-12-07 NOTE — Clinical Social Work Maternal (Signed)
CLINICAL SOCIAL WORK MATERNAL/CHILD NOTE  Patient Details  Name: Brenda Simon MRN: [id number] Date of Birth: 1991/08/29  Date:  12/07/2018  Clinical Social Worker Initiating Note:  Ollen Barges Date/Time: Initiated:  12/07/18/1500     Child's Name:  Brenda Simon   Biological Parents:  Mother, Father(Austin Leodis [place] 03/11/1995)   Need for Interpreter:  None   Reason for Referral:  Current Substance Use/Substance Use During Pregnancy (MOB positive for [facility] [place] 04/2018)   Address:  [place] [address]    Phone number:  [phone] (home)     Additional phone number:   Household Members/Support Persons (HM/SP):   Household Member/Support Person 1, Household Member/Support Person 2   HM/SP Name Relationship DOB or Age  HM/SP -74 70 [place] Mother    HM/SP -2   Sister    HM/SP -3        HM/SP -4        HM/SP -5        HM/SP -6        HM/SP -7        HM/SP -8          Natural Supports (not living in the home):  Spouse/significant other, Friends, Immediate Family, Extended Family   Professional Supports:     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)  [place]      Pediatrician Fax Number:    Risk Factors/Current Problems:  None   Cognitive State:  Able to Concentrate , Alert , Insightful    Mood/Affect:  Calm , Comfortable , Happy , Interested    CSW Assessment: CSW received consult for hx of THC use and history  of depression.  CSW met with MOB to offer support and complete assessment.    MOB sitting in bed eating pop-sicle while baby asleep in basinet. CSW received permission to ask guests to leave while CSW performed assessment. CSW introduced role and reason for consult. MOB understanding and engaged throughout assessment. MOB reported that she currently lives with her mother and her sister. 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 CSW inquired about MOB's mental health history. MOB reported some depression back in 2014 that she associated with a bad break up. MOB denied any current mental health symptoms and has not had any since 2014. MOB denied any current SI/HI or DV in the home. MOB reported she has a strong support system that includes family and friends.   CSW informed MOB of hospital drug policy and inquired about MOB's substance use during pregnancy. MOB reported using marijuana prior to finding out she was pregnant. MOB open about testing positive at her initial [facility] appointment but stated she stopped after that. MOB denied any other substance use during pregnancy. CSW  informed MOB that UDS was still pending and that the CDS would continue to be monitored and a CPS report would be made if warranted. MOB verbalized understanding.  CSW provided education regarding the baby blues period vs. perinatal mood disorders, discussed treatment and gave resources for mental health follow up if concerns arise.  CSW recommends self-evaluation during the postpartum time period using the New Mom Checklist from Postpartum Progress and encouraged MOB to contact a medical professional if symptoms are noted at any time.    CSW provided review of Sudden Infant Death Syndrome (SIDS) precautions.    CSW identifies no further need for intervention and no barriers to discharge at this time.   CSW  Plan/Description:  No Further Intervention Required/No Barriers to Discharge, Sudden Infant Death Syndrome (SIDS) Education, Perinatal Mood and Anxiety Disorder (PMADs) Education, [place], CSW Will Continue to Monitor Umbilical Cord Tissue Drug Screen Results and Make Report if Warranted    Ollen Barges, [place] 12/07/2018, 3:26 PM

## 2018-12-07 NOTE — Progress Notes (Signed)
Subjective: Postpartum Day 1: Cesarean Delivery Patient reports incisional pain, tolerating PO, + flatus and no problems voiding.    Objective: Vital signs in last 24 hours: Temp:  [97.5 F (36.4 C)-99 F (37.2 C)] 98.4 F (36.9 C) (02/26 0500) Pulse Rate:  [68-99] 76 (02/26 0500) Resp:  [16-21] 18 (02/26 0500) BP: (100-133)/(41-89) 108/72 (02/26 0500) SpO2:  [90 %-100 %] 99 % (02/26 0500)  Physical Exam:  General: alert, cooperative and no distress Lochia: appropriate Uterine Fundus: firm Incision: healing well, no significant drainage, no dehiscence DVT Evaluation: No evidence of DVT seen on physical exam. Negative Homan's sign. No cords or calf tenderness.  Recent Labs    12/05/18 1405 12/07/18 0635  HGB 11.7* 8.9*  HCT 36.3 27.4*    Assessment/Plan: Status post Cesarean section. Doing well postoperatively.  Continue current care.  Levie Heritage 12/07/2018, 7:30 AM

## 2018-12-07 NOTE — Lactation Note (Signed)
This note was copied from a baby's chart. Lactation Consultation Note; Mom reports baby latched after delivery but has not nursed since. Has been giving bottles of formula. Attempted to latch baby. She is very fussy, on and off the breast. Would take a few sucks then get fussy. Unable to hand express any Colostrum. Mom getting frustrated. Gave some formula to calm her. Attempted to latch again,. Not as fussy. Still on and off the breast. Mom giving more formula as I left room. Encouraged to always try to breast feed first as soon as she sees feeding cues. Baby had pacifier in mouth when I went into room. Suggested waiting - to breast feed instead of using pacifier. BF brochure given. Reviewed our phone number, OP appointments and BFSG as resources for support after DC. No questions at present. To call for assist prn  Patient Name: Brenda Simon XVEZB'M Date: 12/07/2018 Reason for consult: Initial assessment   Maternal Data Formula Feeding for Exclusion: Yes Reason for exclusion: Mother's choice to formula and breast feed on admission Has patient been taught Hand Expression?: Yes Does the patient have breastfeeding experience prior to this delivery?: No  Feeding Feeding Type: Breast Fed Nipple Type: Extra Slow Flow  LATCH Score Latch: Repeated attempts needed to sustain latch, nipple held in mouth throughout feeding, stimulation needed to elicit sucking reflex.  Audible Swallowing: None  Type of Nipple: Everted at rest and after stimulation  Comfort (Breast/Nipple): Soft / non-tender  Hold (Positioning): Assistance needed to correctly position infant at breast and maintain latch.  LATCH Score: 6  Interventions Interventions: Breast feeding basics reviewed;Skin to skin;Hand express;Breast compression;Assisted with latch;Hand pump  Lactation Tools Discussed/Used WIC Program: Yes   Consult Status Consult Status: Follow-up Date: 12/08/18 Follow-up type: In-patient    Pamelia Hoit 12/07/2018, 12:20 PM

## 2018-12-08 ENCOUNTER — Encounter (HOSPITAL_COMMUNITY): Payer: Self-pay | Admitting: Family Medicine

## 2018-12-08 MED ORDER — SENNOSIDES-DOCUSATE SODIUM 8.6-50 MG PO TABS: 2.0000 | ORAL_TABLET | ORAL | 0 refills | Status: DC

## 2018-12-08 MED ORDER — OXYCODONE-ACETAMINOPHEN 5-325 MG PO TABS: 1.0000 | ORAL_TABLET | Freq: Four times a day (QID) | ORAL | 0 refills | Status: DC | PRN

## 2018-12-08 MED ORDER — IBUPROFEN 800 MG PO TABS: 800.0000 mg | ORAL_TABLET | Freq: Four times a day (QID) | ORAL | 1 refills | Status: DC

## 2018-12-08 NOTE — Progress Notes (Signed)
Dressing changed per order with assistance of Lourdes Sledge, RN. Incision clean and intact. New honeycomb intact with no drainage or issues.

## 2018-12-08 NOTE — Discharge Summary (Signed)
Postpartum Discharge Summary     Patient Name: Brenda Simon DOB: [birth year] MRN: [id number]  Date of admission: 12/05/2018 Delivering Provider: Levie Heritage   Date of discharge: 12/08/2018  Admitting diagnosis: induction Intrauterine pregnancy: [redacted]w[redacted]d     Secondary diagnosis:  Active Problems:   Oligohydramnios  Additional problems: Sickle Cell Trait     Discharge diagnosis: Term Pregnancy Delivered                                                                                                Post partum procedures:None  Augmentation: Pitocin, Cytotec and Foley Balloon  Complications: None  Hospital course:  Induction of Labor With Unplanned C/S  28 y.o. yo G1P1001 at [redacted]w[redacted]d was admitted to the hospital on 12/05/2018 for induction of labor. Patient had a labor course significant for Oligohydramnios, arrest of dilation, and non-reassuring FHT unresponsive to amnioinvusion. Membrane Rupture Time/Date: 7:23 AM ,12/06/2018   The patient went for cesarean section due to Arrest of Dilation and Non-Reassuring FHR, and delivered a Viable infant,12/06/2018  Details of operation can be found in separate operative note. Patient had an uncomplicated postpartum course.  She is ambulating,tolerating a regular diet, passing flatus, and urinating well.  Patient is discharged home in stable condition 12/08/18.  Magnesium Sulfate recieved: No BMZ received: No  Physical exam  Vitals:   12/07/18 1145 12/07/18 1410 12/07/18 2234 12/08/18 0642  BP:  110/65 110/79 108/73  Pulse:  83 89 76  Resp:  18 18 17   Temp:  98 F (36.7 C)  98.2 F (36.8 C)  TempSrc:  Oral  Oral  SpO2: 99% 99%  98%  Weight:      Height:       General: alert, cooperative and no distress Lochia: appropriate Uterine Fundus: firm Incision: Healing well with some old drainage noted on dressing, however no signs of active bleed, No significant erythema DVT Evaluation: No evidence of DVT seen on physical  exam. Labs: Lab Results  Component Value Date   WBC 14.3 (H) 12/07/2018   HGB 8.9 (L) 12/07/2018   HCT 27.4 (L) 12/07/2018   MCV 81.8 12/07/2018   PLT 213 12/07/2018   CMP Latest Ref Rng & Units 12/07/2018  Glucose 70 - 99 mg/dL -  BUN 6 - 20 mg/dL -  Creatinine 7.40 - 8.14 mg/dL 4.81  Sodium 856 - 314 mmol/L -  Potassium 3.5 - 5.1 mmol/L -  Chloride 98 - 111 mmol/L -  CO2 22 - 32 mmol/L -  Calcium 8.9 - 10.3 mg/dL -  Total Protein 6.5 - 8.1 g/dL -  Total Bilirubin 0.3 - 1.2 mg/dL -  Alkaline Phos 38 - 970 U/L -  AST 15 - 41 U/L -  ALT 0 - 44 U/L -    Discharge instruction: per After Visit Summary and "Baby and Me Booklet".  After visit meds:  Allergies as of 12/08/2018   No Known Allergies     Medication List    STOP taking these medications   COMFORT FIT MATERNITY SUPP LG Misc     TAKE these medications  ibuprofen 800 MG tablet Commonly known as:  ADVIL,MOTRIN Take 1 tablet (800 mg total) by mouth every 6 (six) hours.   oxyCODONE-acetaminophen 5-325 MG tablet Commonly known as:  PERCOCET Take 1 tablet by mouth every 6 (six) hours as needed for severe pain.   prenatal multivitamin Tabs tablet Take 1 tablet by mouth daily at 12 noon.   senna-docusate 8.6-50 MG tablet Commonly known as:  Senokot-S Take 2 tablets by mouth daily. Start taking on:  December 09, 2018       Diet: routine diet  Activity: Advance as tolerated. Pelvic rest for 6 weeks.   Outpatient follow up:2 weeks for incision check and 4 weeks for postpartum visit Follow up Appt:No future appointments. Follow up Visit: Follow-up Information    [facility].   Why:  Mom is calling Contact information: Fax [phone]       Department, [facility]. Schedule an appointment as soon as possible for a visit in 2 week(s).   Why:  for incision check Please also make a 4 week postpartum visit as well Contact information: [address] [place] [address] [phone]           Please schedule this patient for Postpartum visit in: 4 weeks with the following provider: Any provider For C/S patients schedule nurse incision check in weeks 2 weeks: yes Low risk pregnancy complicated by: Oligohydramnios Delivery mode:  CS Anticipated Birth Control:  other/unsure - Please continue to discuss with patient PP Procedures needed: Incision check  Schedule Integrated BH visit: no  Newborn Data: Live born female  Birth Weight: 7 lb 0.2 oz (3180 g) APGAR: 9, 9  Newborn Delivery   Birth date/time:  12/06/2018 14:53:00 Delivery type:  C-Section, Low Vertical Trial of labor:  Yes C-section categorization:  Primary     Baby Feeding: Breast Disposition:home with mother   12/08/2018 Sharen Counter, CNM

## 2018-12-08 NOTE — Discharge Instructions (Signed)

## 2018-12-08 NOTE — Lactation Note (Signed)
This note was copied from a baby's chart. Lactation Consultation Note  Patient Name: Brenda Simon BTDHR'C Date: 12/08/2018 Reason for consult: Follow-up assessment   Mom tells LC she strongly desires to BF.  LC reviewed hand exp., (no drops seen but mom returned demo), feeding cues, importance of STS, feeding 8-12 times in 24 hours, and how to latch infant with good head/neck support.  Mom desires to try while LC is in room. Infant had formula over an hour ago.  Infant was undressed and put to the breast.  Infant opened to latch but then feel asleep without any sucking.  LC repositioned infant from football to cross cradle, then also tried on the other breast.  Infant was too sleepy to feed.  LC answered all of mom's questions and encouraged her to call out, if desired, for assistance with latching for the next feed.  LC also reminded mom of lactation phone line, OP services, and support groups available.   Maternal Data    Feeding Feeding Type: Breast Fed Nipple Type: Slow - flow  LATCH Score                   Interventions Interventions: Breast feeding basics reviewed;Skin to skin;Breast massage;Hand express;Position options;Support pillows  Lactation Tools Discussed/Used     Consult Status Consult Status: Follow-up Date: 12/09/18 Follow-up type: In-patient    Maryruth Hancock [facility] 12/08/2018, 6:23 PM

## 2018-12-08 NOTE — Progress Notes (Signed)
OB/GYN Interim Progress Note  Per report, patient did not feel comfortable being discharged today. Baby to be discharged tomorrow (2/28). Will plan for discharge on POD#3 on 2/28.  Orpah Cobb Kappa, DO 12/08/2018, 7:37 PM PGY-1, [facility]

## 2018-12-09 NOTE — Lactation Note (Signed)
This note was copied from a baby's chart. Lactation Consultation Note  Patient Name: Brenda Simon [id number]'L Date: 12/09/2018   Mom would like to breast feed, but infant won't latch after having received so many bottles. A nipple shield (size 20) was applied & prefilled with formula. Infant latched with ease, but would then unlatch once the formula had been drunk (despite breast compressions). Mom was amenable to trying a 5 Fr & syringe. Infant drank a little bit more and then fell asleep.   Mom & MGM were shown how to wash pump parts, nipple shield, & 5 Fr tube/syringe. They understand to sanitize pump parts & nipple shield once/day.   Mom is interested in having an outpatient lactation appt. Mom has WIC, but declined getting a [facility]. Mom has been using a hand pump. Her nipple diameter is small & suggests that she needs a size 21 flange (2 were provided along with an extra size 20 nipple shield). Mom knows to pump every time infant receives formula.  Lurline Hare [facility] 12/09/2018, 11:13 AM

## 2018-12-09 NOTE — Discharge Summary (Signed)
Postpartum Discharge Summary     Patient Name: Brenda Simon DOB: [birth year] MRN: [id number]  Date of admission: 12/05/2018 Delivering Provider: Levie Heritage   Date of discharge: 12/09/2018  Admitting diagnosis: induction Intrauterine pregnancy: [redacted]w[redacted]d     Secondary diagnosis:  Active Problems:   Oligohydramnios  Additional problems: Sickle Cell Trait     Discharge diagnosis: Term Pregnancy Delivered                                                                                                Post partum procedures:None  Augmentation: Pitocin, Cytotec and Foley Balloon  Complications: None  Hospital course:  Induction of Labor With Unplanned C/S  28 y.o. yo G1P1001 at [redacted]w[redacted]d was admitted to the hospital on 12/05/2018 for induction of labor. Patient had a labor course significant for Oligohydramnios, arrest of dilation, and non-reassuring FHT unresponsive to amnioinvusion. Membrane Rupture Time/Date: 7:23 AM ,12/06/2018   The patient went for cesarean section due to Arrest of Dilation and Non-Reassuring FHR, and delivered a Viable infant,12/06/2018  Details of operation can be found in separate operative note. Patient had an uncomplicated postpartum course.  She is ambulating,tolerating a regular diet, passing flatus, and urinating well.  Patient is discharged home in stable condition 12/09/18.  Magnesium Sulfate recieved: No BMZ received: No  Physical exam  Vitals:   12/08/18 0642 12/08/18 1435 12/08/18 2219 12/09/18 0543  BP: 108/73 115/76 103/70 107/61  Pulse: 76 83 84 72  Resp: 17 16 18 16   Temp: 98.2 F (36.8 C) 98.4 F (36.9 C)  98.1 F (36.7 C)  TempSrc: Oral Oral  Oral  SpO2: 98%  100% 98%  Weight:      Height:       General: alert, cooperative and no distress Lochia: appropriate Uterine Fundus: firm Incision: Healing well with some old drainage noted on dressing, however no signs of active bleed, No significant erythema DVT Evaluation: No evidence of DVT  seen on physical exam. Labs: Lab Results  Component Value Date   WBC 14.3 (H) 12/07/2018   HGB 8.9 (L) 12/07/2018   HCT 27.4 (L) 12/07/2018   MCV 81.8 12/07/2018   PLT 213 12/07/2018   CMP Latest Ref Rng & Units 12/07/2018  Glucose 70 - 99 mg/dL -  BUN 6 - 20 mg/dL -  Creatinine 2.80 - 0.34 mg/dL 9.17  Sodium 915 - 056 mmol/L -  Potassium 3.5 - 5.1 mmol/L -  Chloride 98 - 111 mmol/L -  CO2 22 - 32 mmol/L -  Calcium 8.9 - 10.3 mg/dL -  Total Protein 6.5 - 8.1 g/dL -  Total Bilirubin 0.3 - 1.2 mg/dL -  Alkaline Phos 38 - 979 U/L -  AST 15 - 41 U/L -  ALT 0 - 44 U/L -    Discharge instruction: per After Visit Summary and "Baby and Me Booklet".  After visit meds:  Allergies as of 12/09/2018   No Known Allergies     Medication List    STOP taking these medications   COMFORT FIT MATERNITY SUPP LG Misc  TAKE these medications   ibuprofen 800 MG tablet Commonly known as:  ADVIL,MOTRIN Take 1 tablet (800 mg total) by mouth every 6 (six) hours.   oxyCODONE-acetaminophen 5-325 MG tablet Commonly known as:  PERCOCET Take 1 tablet by mouth every 6 (six) hours as needed for severe pain.   prenatal multivitamin Tabs tablet Take 1 tablet by mouth daily at 12 noon.   senna-docusate 8.6-50 MG tablet Commonly known as:  Senokot-S Take 2 tablets by mouth daily.            Discharge Care Instructions  (From admission, onward)         Start     Ordered   12/09/18 0000  Discharge wound care:    Comments:  It is okay to get honeycomb dressing wet. Please leave dressing on until follow-up visit.   12/09/18 0808          Diet: routine diet  Activity: Advance as tolerated. Pelvic rest for 6 weeks.   Outpatient follow up:2 weeks for incision check and 4 weeks for postpartum visit Follow up Appt:No future appointments. Follow up Visit: Follow-up Information    [facility].   Why:  Mom is calling Contact information: Fax [phone]       Department, [facility]. Schedule an appointment as soon as possible for a visit in 2 week(s).   Why:  for incision check Please also make a 6 week postpartum visit as well Contact information: [address] [place] [address] [phone]          Please schedule this patient for Postpartum visit in: 4 weeks with the following provider: Any provider For C/S patients schedule nurse incision check in weeks 2 weeks: yes Low risk pregnancy complicated by: Oligohydramnios Delivery mode:  CS Anticipated Birth Control:  other/unsure - Please continue to discuss with patient PP Procedures needed: Incision check  Schedule Integrated BH visit: no  Newborn Data: Live born female  Birth Weight: 7 lb 0.2 oz (3180 g) APGAR: 9, 9  Newborn Delivery   Birth date/time:  12/06/2018 14:53:00 Delivery type:  C-Section, Low Vertical Trial of labor:  Yes C-section categorization:  Primary     Baby Feeding: Breast Disposition:home with mother   12/09/2018 Jacklyn Shell, CNM

## 2018-12-10 ENCOUNTER — Inpatient Hospital Stay (HOSPITAL_COMMUNITY): Admission: RE | Admit: 2018-12-10 | Payer: Medicaid Other | Source: Ambulatory Visit

## 2018-12-14 ENCOUNTER — Other Ambulatory Visit: Payer: Self-pay

## 2018-12-14 ENCOUNTER — Ambulatory Visit (INDEPENDENT_AMBULATORY_CARE_PROVIDER_SITE_OTHER): Payer: Medicaid Other | Admitting: *Deleted

## 2018-12-14 NOTE — Progress Notes (Signed)
Here for incision check s/p S/S 12/06/18/.Incision clean , dry, intact with steristrips. No redness, discharge or edema noted. Removed 1/2 of steristrips since only 8 days out and instructed patient to remove remaining within 3 days. Advised to schedule pospartum visit for 4 weeks. Instructed to call us or go to [facility] if any issues with incision. She voices understanding.  Brenda Simon

## 2018-12-16 NOTE — Progress Notes (Signed)
I have reviewed this chart and agree with the RN/CMA assessment and management.    Ethin Drummond C Claborn Janusz, MD, FACOG Attending Physician, [facility]  

## 2019-09-08 ENCOUNTER — Other Ambulatory Visit: Payer: Self-pay

## 2019-09-08 ENCOUNTER — Encounter (HOSPITAL_COMMUNITY): Payer: Self-pay

## 2019-09-08 ENCOUNTER — Ambulatory Visit (HOSPITAL_COMMUNITY)
Admission: EM | Admit: 2019-09-08 | Discharge: 2019-09-08 | Disposition: A | Discharge status: Home / Self Care | Payer: Medicaid Other | Attending: Family Medicine | Admitting: Family Medicine

## 2019-09-08 DIAGNOSIS — L0231 Cutaneous abscess of buttock: Secondary | ICD-10-CM | POA: Diagnosis not present

## 2019-09-08 MED ORDER — SULFAMETHOXAZOLE-TRIMETHOPRIM 800-160 MG PO TABS: 1.0000 | ORAL_TABLET | Freq: Two times a day (BID) | ORAL | 0 refills | Status: AC

## 2019-09-08 MED ORDER — OXYCODONE-ACETAMINOPHEN 5-325 MG PO TABS: 1.0000 | ORAL_TABLET | Freq: Four times a day (QID) | ORAL | 0 refills | Status: DC | PRN

## 2019-09-08 NOTE — Discharge Instructions (Addendum)
Warm soaks  Take the antibiotic as directed Take pain medicine as needed Do not drive on the pain medicine

## 2019-09-08 NOTE — ED Provider Notes (Signed)
[facility]    CSN: [id number] Arrival date & time: 09/08/19  1321      History   Chief Complaint Chief Complaint  Patient presents with  . Abscess    HPI Brenda Simon is a 28 y.o. female.   HPI  Patient has an abscess on her buttocks.  This morning it spontaneously ruptured.  Still very painful.  She cannot sit.  She is having trouble walking.  This is a recurrent problem. Patient is certain that she is not pregnant.  Past Medical History:  Diagnosis Date  . Chlamydia   . Sickle cell trait [facility])     Patient Active Problem List   Diagnosis Date Noted  . Oligohydramnios 12/05/2018  . Depression 12/10/2012    Past Surgical History:  Procedure Laterality Date  . CESAREAN SECTION N/A 12/06/2018   Procedure: CESAREAN SECTION;  Surgeon: Levie Heritage, DO;  Location: [facility];  Service: Obstetrics;  Laterality: N/A;  . NO PAST SURGERIES      OB History    Gravida  1   Para  1   Term  1   Preterm      AB      Living  1     SAB      TAB      Ectopic      Multiple  0   Live Births  1            Home Medications    Prior to Admission medications   Medication Sig Start Date End Date Taking? Authorizing Provider  oxyCODONE-acetaminophen (PERCOCET) 5-325 MG tablet Take 1 tablet by mouth every 6 (six) hours as needed for severe pain. 09/08/19 09/07/20  Eustace Moore, MD  Prenatal Vit-Fe Fumarate-FA (PRENATAL MULTIVITAMIN) TABS tablet Take 1 tablet by mouth daily at 12 noon.    [provider]  sulfamethoxazole-trimethoprim (BACTRIM DS) 800-160 MG tablet Take 1 tablet by mouth 2 (two) times daily for 7 days. 09/08/19 09/15/19  Eustace Moore, MD    Family History Family History  Problem Relation Age of Onset  . Hypertension Mother     Social History Social History   Tobacco Use  . Smoking status: Former Smoker    Packs/day: 1.50    Types: Cigars  . Smokeless tobacco: Never Used  . Tobacco comment: stopped  after pos. UPT  Substance Use Topics  . Alcohol use: No  . Drug use: Not Currently    Types: Marijuana     Allergies   Patient has no known allergies.   Review of Systems Review of Systems  Constitutional: Negative for chills and fever.  HENT: Negative for ear pain and sore throat.   Eyes: Negative for pain and visual disturbance.  Respiratory: Negative for cough and shortness of breath.   Cardiovascular: Negative for chest pain and palpitations.  Gastrointestinal: Negative for abdominal pain and vomiting.  Genitourinary: Negative for dysuria and hematuria.  Musculoskeletal: Negative for arthralgias and back pain.  Skin: Positive for wound. Negative for color change and rash.  Neurological: Negative for seizures and syncope.  All other systems reviewed and are negative.    Physical Exam Triage Vital Signs ED Triage Vitals  Enc Vitals Group     BP 09/08/19 1433 128/65     Pulse Rate 09/08/19 1433 (!) 110     Resp 09/08/19 1433 16     Temp 09/08/19 1433 98.6 F (37 C)     Temp Source  09/08/19 1433 Oral     SpO2 09/08/19 1433 99 %     Weight --      Height --      Head Circumference --      Peak Flow --      Pain Score 09/08/19 1432 9     Pain Loc --      Pain Edu? --      Excl. in [place]? --    No data found.  Updated Vital Signs BP 128/65 (BP Location: Right Arm)   Pulse (!) 110   Temp 98.6 F (37 C) (Oral)   Resp 16   LMP 08/01/2019   SpO2 99%   Visual Acuity Right Eye Distance:   Left Eye Distance:   Bilateral Distance:    Right Eye Near:   Left Eye Near:    Bilateral Near:     Physical Exam Constitutional:      General: She is not in acute distress.    Appearance: She is well-developed.     Comments: Appears uncomfortable.  HENT:     Head: Normocephalic and atraumatic.  Eyes:     Conjunctiva/sclera: Conjunctivae normal.     Pupils: Pupils are equal, round, and reactive to light.  Neck:     Musculoskeletal: Normal range of motion.   Cardiovascular:     Rate and Rhythm: Normal rate.  Pulmonary:     Effort: Pulmonary effort is normal. No respiratory distress.  Abdominal:     General: There is no distension.     Palpations: Abdomen is soft.  Musculoskeletal: Normal range of motion.  Skin:    General: Skin is warm and dry.          Comments: 3 cm indurated area with open draining purulence  Neurological:     Mental Status: She is alert.  Psychiatric:        Mood and Affect: Mood normal.        Behavior: Behavior normal.      UC Treatments / Results  Labs (all labs ordered are listed, but only abnormal results are displayed) Labs Reviewed - No data to display  EKG   Radiology No results found.  Procedures Procedures (including critical care time)  Medications Ordered in UC Medications - No data to display  Initial Impression / Assessment and Plan / UC Course  I have reviewed the triage vital signs and the nursing notes.  Pertinent labs & imaging results that were available during my care of the patient were reviewed by me and considered in my medical decision making (see chart for details).      Final Clinical Impressions(s) / UC Diagnoses   Final diagnoses:  Abscess of buttock, right     Discharge Instructions     Warm soaks  Take the antibiotic as directed Take pain medicine as needed Do not drive on the pain medicine   ED Prescriptions    Medication Sig Dispense Auth. Provider   oxyCODONE-acetaminophen (PERCOCET) 5-325 MG tablet Take 1 tablet by mouth every 6 (six) hours as needed for severe pain. 10 tablet Raylene Everts, MD   sulfamethoxazole-trimethoprim (BACTRIM DS) 800-160 MG tablet Take 1 tablet by mouth 2 (two) times daily for 7 days. 14 tablet Raylene Everts, MD     I have reviewed the PDMP during this encounter.   Raylene Everts, MD 09/08/19 [phone]

## 2019-09-08 NOTE — ED Triage Notes (Signed)
Pt states having an abscess in her right buttock x 1 week. Pt states is painful when sitting and walking.

## 2019-11-13 ENCOUNTER — Other Ambulatory Visit: Payer: Medicaid Other

## 2019-11-13 ENCOUNTER — Ambulatory Visit: Payer: Medicaid Other | Attending: Internal Medicine

## 2019-11-13 DIAGNOSIS — Z20822 Contact with and (suspected) exposure to covid-19: Secondary | ICD-10-CM

## 2019-11-14 LAB — NOVEL CORONAVIRUS, NAA: SARS-CoV-2, NAA: NOT DETECTED

## 2019-11-27 ENCOUNTER — Other Ambulatory Visit: Payer: Medicaid Other

## 2020-05-23 ENCOUNTER — Ambulatory Visit (HOSPITAL_COMMUNITY)
Admission: EM | Admit: 2020-05-23 | Discharge: 2020-05-23 | Disposition: A | Discharge status: Home / Self Care | Payer: Medicaid Other | Attending: Family Medicine | Admitting: Family Medicine

## 2020-05-23 ENCOUNTER — Other Ambulatory Visit: Payer: Self-pay

## 2020-05-23 ENCOUNTER — Encounter (HOSPITAL_COMMUNITY): Payer: Self-pay

## 2020-05-23 DIAGNOSIS — L0231 Cutaneous abscess of buttock: Secondary | ICD-10-CM

## 2020-05-23 MED ORDER — AMOXICILLIN-POT CLAVULANATE 875-125 MG PO TABS: 1.0000 | ORAL_TABLET | Freq: Two times a day (BID) | ORAL | 0 refills | Status: DC

## 2020-05-23 MED ORDER — OXYCODONE-ACETAMINOPHEN 5-325 MG PO TABS: 1.0000 | ORAL_TABLET | Freq: Four times a day (QID) | ORAL | 0 refills | Status: DC | PRN

## 2020-05-23 NOTE — ED Provider Notes (Signed)
[facility]    CSN: [id number] Arrival date & time: 05/23/20  1503      History   Chief Complaint Chief Complaint  Patient presents with  . Abscess    HPI Brenda Simon is a 29 y.o. female.   HPI Patient has recurring abscesses They usually are in the same region, lower right buttocks Painful to sit She is having some drainage Is here requesting antibiotics and pain medication This is what she came in for last time I saw her for her abscess and she states it worked well  Past Medical History:  Diagnosis Date  . Chlamydia   . Sickle cell trait [facility])     Patient Active Problem List   Diagnosis Date Noted  . Oligohydramnios 12/05/2018  . Depression 12/10/2012    Past Surgical History:  Procedure Laterality Date  . CESAREAN SECTION N/A 12/06/2018   Procedure: CESAREAN SECTION;  Surgeon: Levie Heritage, DO;  Location: [facility];  Service: Obstetrics;  Laterality: N/A;  . NO PAST SURGERIES      OB History    Gravida  1   Para  1   Term  1   Preterm      AB      Living  1     SAB      TAB      Ectopic      Multiple  0   Live Births  1            Home Medications    Prior to Admission medications   Medication Sig Start Date End Date Taking? Authorizing Provider  amoxicillin-clavulanate (AUGMENTIN) 875-125 MG tablet Take 1 tablet by mouth every 12 (twelve) hours. 05/23/20   Eustace Moore, MD  oxyCODONE-acetaminophen (PERCOCET) 5-325 MG tablet Take 1 tablet by mouth every 6 (six) hours as needed for severe pain. 05/23/20 05/23/21  Eustace Moore, MD  Prenatal Vit-Fe Fumarate-FA (PRENATAL MULTIVITAMIN) TABS tablet Take 1 tablet by mouth daily at 12 noon.    [provider]    Family History Family History  Problem Relation Age of Onset  . Hypertension Mother     Social History Social History   Tobacco Use  . Smoking status: Former Smoker    Packs/day: 1.50    Types: Cigars  . Smokeless tobacco: Never  Used  . Tobacco comment: stopped after pos. UPT  Vaping Use  . Vaping Use: Never used  Substance Use Topics  . Alcohol use: No  . Drug use: Not Currently    Types: Marijuana     Allergies   Patient has no known allergies.   Review of Systems Review of Systems See HPI  Physical Exam Triage Vital Signs ED Triage Vitals  Enc Vitals Group     BP 05/23/20 1647 116/86     Pulse Rate 05/23/20 1647 (!) 120     Resp 05/23/20 1647 20     Temp 05/23/20 1647 98.3 F (36.8 C)     Temp Source 05/23/20 1647 Oral     SpO2 05/23/20 1647 99 %     Weight --      Height --      Head Circumference --      Peak Flow --      Pain Score 05/23/20 1646 10     Pain Loc --      Pain Edu? --      Excl. in [place]? --    No  data found.  Updated Vital Signs BP 116/86 (BP Location: Right Arm)   Pulse (!) 120   Temp 98.3 F (36.8 C) (Oral)   Resp 20   LMP 05/12/2020   SpO2 99%      Physical Exam Constitutional:      General: She is not in acute distress.    Appearance: She is well-developed.  HENT:     Head: Normocephalic and atraumatic.  Eyes:     Conjunctiva/sclera: Conjunctivae normal.     Pupils: Pupils are equal, round, and reactive to light.  Cardiovascular:     Rate and Rhythm: Normal rate.  Pulmonary:     Effort: Pulmonary effort is normal. No respiratory distress.  Abdominal:     General: There is no distension.     Palpations: Abdomen is soft.  Musculoskeletal:        General: Normal range of motion.     Cervical back: Normal range of motion.  Skin:    General: Skin is warm and dry.          Comments: 2 and half centimeters of induration with a fluctuant center, there is a small opening with purulence draining.  Patient declines I&D  Neurological:     Mental Status: She is alert.  Psychiatric:        Mood and Affect: Mood normal.        Behavior: Behavior normal.      UC Treatments / Results  Labs (all labs ordered are listed, but only abnormal results are  displayed) Labs Reviewed - No data to display  EKG   Radiology No results found.  Procedures Procedures (including critical care time)  Medications Ordered in UC Medications - No data to display  Initial Impression / Assessment and Plan / UC Course  I have reviewed the triage vital signs and the nursing notes.  Pertinent labs & imaging results that were available during my care of the patient were reviewed by me and considered in my medical decision making (see chart for details).     Wound care discussed,  reasons for return discussed Final Clinical Impressions(s) / UC Diagnoses   Final diagnoses:  Abscess of buttock, right     Discharge Instructions     Sit in warm tub or use warm compress to area for 20 minutes every couple of hours Take antibiotic 2 times a day Take pain medication as needed Do not drive on the pain medication Follow-up with your primary care doctor    ED Prescriptions    Medication Sig Dispense Auth. Provider   oxyCODONE-acetaminophen (PERCOCET) 5-325 MG tablet Take 1 tablet by mouth every 6 (six) hours as needed for severe pain. 10 tablet Eustace Moore, MD   amoxicillin-clavulanate (AUGMENTIN) 875-125 MG tablet Take 1 tablet by mouth every 12 (twelve) hours. 14 tablet Eustace Moore, MD     I have reviewed the PDMP during this encounter.   Eustace Moore, MD 05/23/20 [phone]

## 2020-05-23 NOTE — ED Triage Notes (Signed)
Pt presents with recurrent abscess on the bottom area of right buttocks X 1 week.

## 2020-05-23 NOTE — Discharge Instructions (Addendum)
Sit in warm tub or use warm compress to area for 20 minutes every couple of hours Take antibiotic 2 times a day Take pain medication as needed Do not drive on the pain medication Follow-up with your primary care doctor

## 2020-09-29 ENCOUNTER — Ambulatory Visit (INDEPENDENT_AMBULATORY_CARE_PROVIDER_SITE_OTHER): Payer: Medicaid Other

## 2020-09-29 ENCOUNTER — Ambulatory Visit
Admission: EM | Admit: 2020-09-29 | Discharge: 2020-09-29 | Disposition: A | Discharge status: Home / Self Care | Payer: Medicaid Other | Attending: Family Medicine | Admitting: Family Medicine

## 2020-09-29 ENCOUNTER — Other Ambulatory Visit: Payer: Self-pay

## 2020-09-29 DIAGNOSIS — M79641 Pain in right hand: Secondary | ICD-10-CM

## 2020-09-29 DIAGNOSIS — W19XXXA Unspecified fall, initial encounter: Secondary | ICD-10-CM | POA: Diagnosis not present

## 2020-09-29 DIAGNOSIS — S63641A Sprain of metacarpophalangeal joint of right thumb, initial encounter: Secondary | ICD-10-CM | POA: Diagnosis not present

## 2020-09-29 NOTE — Discharge Instructions (Addendum)
Please try ice  Please use the brace  Please follow up in a few days

## 2020-09-29 NOTE — ED Triage Notes (Signed)
Patient states she fell Friday night. Pt has notable swelling in her right hand and loss of rom as well. Pt is aox4 and ambulatory.

## 2020-09-29 NOTE — ED Provider Notes (Signed)
[facility]    CSN: [id number] Arrival date & time: 09/29/20  1010      History   Chief Complaint Chief Complaint  Patient presents with  . Hand Injury    Fall occurred Friday night    HPI Brenda Simon is a 29 y.o. female.   She is presenting with right hand pain after a fall.  She fell and now has limitations with a range of movement of her thumb as well as her index and middle finger.  Has bruising over the dorsum of the hand.  Pain with flexion extension of the wrist.  HPI  Past Medical History:  Diagnosis Date  . Chlamydia   . Sickle cell trait [facility])     Patient Active Problem List   Diagnosis Date Noted  . Oligohydramnios 12/05/2018  . Depression 12/10/2012    Past Surgical History:  Procedure Laterality Date  . CESAREAN SECTION N/A 12/06/2018   Procedure: CESAREAN SECTION;  Surgeon: Brenda Heritage, DO;  Location: [facility];  Service: Obstetrics;  Laterality: N/A;  . NO PAST SURGERIES      OB History    Gravida  1   Para  1   Term  1   Preterm      AB      Living  1     SAB      IAB      Ectopic      Multiple  0   Live Births  1            Home Medications    Prior to Admission medications   Medication Sig Start Date End Date Taking? Authorizing Provider  oxyCODONE-acetaminophen (PERCOCET) 5-325 MG tablet Take 1 tablet by mouth every 6 (six) hours as needed for severe pain. 05/23/20 05/23/21  Brenda Moore, MD    Family History Family History  Problem Relation Age of Onset  . Hypertension Mother     Social History Social History   Tobacco Use  . Smoking status: Former Smoker    Packs/day: 1.50    Types: Cigars  . Smokeless tobacco: Never Used  . Tobacco comment: stopped after pos. UPT  Vaping Use  . Vaping Use: Never used  Substance Use Topics  . Alcohol use: No  . Drug use: Not Currently    Types: Marijuana     Allergies   Patient has no known allergies.   Review of Systems Review of  Systems  See HPI  Physical Exam Triage Vital Signs ED Triage Vitals [09/29/20 1051]  Enc Vitals Group     BP      Pulse      Resp      Temp      Temp src      SpO2      Weight      Height      Head Circumference      Peak Flow      Pain Score 7     Pain Loc      Pain Edu?      Excl. in [place]?    No data found.  Updated Vital Signs LMP 08/18/2020 (Approximate)   Visual Acuity Right Eye Distance:   Left Eye Distance:   Bilateral Distance:    Right Eye Near:   Left Eye Near:    Bilateral Near:     Physical Exam Gen: NAD, alert, cooperative with exam, well-appearing ENT: normal lips, normal nasal mucosa,  Neuro: normal tone, normal sensation to touch Psych:  normal insight, alert and oriented MSK:  Right hand: Swelling over the thumb and at the base. Limited extension abduction of the thumb. Swelling over the dorsum of the MCP joint of the third digit. Limited finger range of motion. No malrotation or misalignment. Pain with extension and flexion of the wrist. Neurovascular intact   UC Treatments / Results  Labs (all labs ordered are listed, but only abnormal results are displayed) Labs Reviewed - No data to display  EKG   Radiology DG Hand Complete Right  Result Date: 09/29/2020 CLINICAL DATA:  And pain status post fall. Pain to the third MCP joint. EXAM: RIGHT HAND - COMPLETE 3+ VIEW COMPARISON:  None. FINDINGS: There is no evidence of fracture or dislocation. There is no evidence of arthropathy or other focal bone abnormality. Soft tissues are unremarkable. IMPRESSION: No acute osseous abnormality Electronically Signed   By: Brenda Simon M.D.   On: 09/29/2020 11:56    Procedures Procedures (including critical care time)  Medications Ordered in UC Medications - No data to display  Initial Impression / Assessment and Plan / UC Course  I have reviewed the triage vital signs and the nursing notes.  Pertinent labs & imaging results that were available  during my care of the patient were reviewed by me and considered in my medical decision making (see chart for details).     Brenda Simon is a 29 year old female is presenting with an injury to the right hand.  She is having swelling at the base of the thumb as well as the dorsum of the hand.  Imaging was negative for fracture.  Possible for scaphoid injury.  Will place in a splint.  Provided work note.  Advised follow-up.  Counseled on supportive care.  Final Clinical Impressions(s) / UC Diagnoses   Final diagnoses:  Sprain of metacarpophalangeal (MCP) joint of right thumb, initial encounter     Discharge Instructions     Please try ice  Please use the brace  Please follow up in a few days      ED Prescriptions    None     PDMP not reviewed this encounter.   Myra Rude, MD 09/29/20 [phone]

## 2020-10-01 ENCOUNTER — Telehealth (HOSPITAL_COMMUNITY): Payer: Self-pay | Admitting: *Deleted

## 2020-10-01 NOTE — Telephone Encounter (Signed)
Pt left msg stating her Rx is not at the pharmacy from 09/29/20; upon review of medical record, informed pt there was no Rx to be sent; informed pt she may use Tylenol and/or IBU prn for pain.  Pt also confirming f/u -- recommend f/u with Sports Medicine as per AVS instructions.  Pt verbalized understanding of all instructions.

## 2020-10-21 ENCOUNTER — Other Ambulatory Visit: Payer: Self-pay

## 2020-10-21 ENCOUNTER — Ambulatory Visit: Payer: Medicaid Other | Admitting: Sports Medicine

## 2020-10-21 ENCOUNTER — Ambulatory Visit
Admission: RE | Admit: 2020-10-21 | Discharge: 2020-10-21 | Disposition: A | Discharge status: Home / Self Care | Payer: Medicaid Other | Source: Ambulatory Visit | Attending: Sports Medicine | Admitting: Sports Medicine

## 2020-10-21 VITALS — BP 122/88 | Ht 64.0 in | Wt 145.0 lb

## 2020-10-21 DIAGNOSIS — M25531 Pain in right wrist: Secondary | ICD-10-CM

## 2020-10-21 NOTE — Progress Notes (Signed)
    SUBJECTIVE:   CHIEF COMPLAINT / HPI: F/u R hand pain   Brenda Simon is a 30 year old female presenting for an urgent care follow-up for right hand pain.  She was seen at [facility] on 12/19 bilateral hand pain, R>L, after falling on outstretched hand while playing twister.  Complete right hand x-rays were obtained at that time with no abnormality.  She was placed in a splint.  Today, she reports that she continues to have right hand pain, specifically within her right thumb that has not improved since onset.  Has not noticed any swelling or bruising left over.  She wore the splint for 2 weeks and has not worn it consistently since.  Notices pain in her thumb with all thumb motion or twisting motion.  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  PERTINENT  PMH / PSH: Depression   OBJECTIVE:   BP 122/88   Ht 5\' 4"  (1.626 m)   Wt 145 lb (65.8 kg)   BMI 24.89 kg/m    Right Hand: Inspection: No obvious deformity. No erythema or bruising, slight mild soft tissue swelling noted on dorsal aspect of right thumb in comparison to left. Palpation: TTP at MCP joint of first digit/3rd digit, bony portion of proximal thumb, tender to palpation of anatomical snuffbox. ROM: Full ROM of the digits. Full wrist flexion, slight decreased in wrist extension on the right compared to left.  Fully able to extend and flex thumb but with pain elicited.  Strength: 5/5 strength in the forearm, wrist and interosseus muscles Neurovascular: NV intact Special tests: Negative finkelstein's  ASSESSMENT/PLAN:   Right thumb pain: Subacute, unchanged.  Reassured no evidence of a fracture or subluxation present on 12/19 XR, however with persistent right thumb pain and tenderness within anatomical snuffbox will obtain repeat XR (complete wrist/3 view thumb) to rule out scaphoid or thumb fracture.  Could additionally consider posttraumatic tendinitis, but reassured by negative Finkelstein's test today.   Provided with thumb spica brace and will follow-up x-ray results.  Follow-up pending x-ray results, will call tomorrow, 1/11, to discuss continued plan.  3/11, DO [facility]   Patient seen and evaluated with the resident.  I agree with the above plan of care.  X-rays of the right wrist and thumb are reviewed.  No obvious fracture is seen.  She will continue with her thumb spica brace as needed for comfort.  Follow-up with me again in about a month.  If symptoms persist, consider further diagnostic imaging at that time.

## 2020-10-22 ENCOUNTER — Encounter: Payer: Self-pay | Admitting: Sports Medicine

## 2020-11-18 ENCOUNTER — Encounter: Payer: Self-pay | Admitting: Emergency Medicine

## 2020-11-18 ENCOUNTER — Ambulatory Visit
Admission: EM | Admit: 2020-11-18 | Discharge: 2020-11-18 | Disposition: A | Discharge status: Home / Self Care | Payer: Medicaid Other | Attending: Family Medicine | Admitting: Family Medicine

## 2020-11-18 ENCOUNTER — Other Ambulatory Visit: Payer: Self-pay

## 2020-11-18 DIAGNOSIS — N3001 Acute cystitis with hematuria: Secondary | ICD-10-CM

## 2020-11-18 LAB — POCT URINALYSIS DIP (MANUAL ENTRY)
Bilirubin, UA: NEGATIVE
Glucose, UA: NEGATIVE mg/dL
Nitrite, UA: NEGATIVE
Spec Grav, UA: 1.025 (ref 1.010–1.025)
Urobilinogen, UA: 0.2 E.U./dL
pH, UA: 6 (ref 5.0–8.0)

## 2020-11-18 LAB — POCT URINE PREGNANCY: Preg Test, Ur: NEGATIVE

## 2020-11-18 MED ORDER — NITROFURANTOIN MONOHYD MACRO 100 MG PO CAPS: 100.0000 mg | ORAL_CAPSULE | Freq: Two times a day (BID) | ORAL | 0 refills | Status: AC

## 2020-11-18 NOTE — ED Triage Notes (Signed)
Pt sts lower abd pain with some change in smell of urine x 8 days per pt

## 2020-11-18 NOTE — Discharge Instructions (Addendum)
Start Macrobid 100 mg twice daily and complete over the course the next 7 days.  Hydrate well with water. Urine culture is pending if any changes in antibiotics is warranted we will notify you via MyChart.

## 2020-11-18 NOTE — ED Provider Notes (Signed)
[facility]    CSN: [id number] Arrival date & time: 11/18/20  1011      History   Chief Complaint Chief Complaint  Patient presents with  . Abdominal Pain    HPI Brenda Simon is a 30 y.o. female.   HPI  Patient presents today with 8 days of lower abdominal pressure and changes in odor of urine.  Denies nausea or vomiting.  No CVS tenderness. Denies concern for STD [facility] and treated for bacterial vaginosis which she completed treatment for that a little over a week ago.  Past Medical History:  Diagnosis Date  . Chlamydia   . Sickle cell trait [facility])     Patient Active Problem List   Diagnosis Date Noted  . Oligohydramnios 12/05/2018  . Depression 12/10/2012    Past Surgical History:  Procedure Laterality Date  . CESAREAN SECTION N/A 12/06/2018   Procedure: CESAREAN SECTION;  Surgeon: Levie Heritage, DO;  Location: [facility] LD [facility]S;  Service: Obstetrics;  Laterality: N/A;  . NO PAST SURGERIES      OB History    Gravida  1   Para  1   Term  1   Preterm      AB      Living  1     SAB      IAB      Ectopic      Multiple  0   Live Births  1            Home Medications    Prior to Admission medications   Medication Sig Start Date End Date Taking? Authorizing Provider  oxyCODONE-acetaminophen (PERCOCET) 5-325 MG tablet Take 1 tablet by mouth every 6 (six) hours as needed for severe pain. Patient not taking: Reported on 11/18/2020 05/23/20 05/23/21  Eustace Moore, MD    Family History Family History  Problem Relation Age of Onset  . Hypertension Mother     Social History Social History   Tobacco Use  . Smoking status: Former Smoker    Packs/day: 1.50    Types: Cigars  . Smokeless tobacco: Never Used  . Tobacco comment: stopped after pos. UPT  Vaping Use  . Vaping Use: Never used  Substance Use Topics  . Alcohol use: No  . Drug use: Not Currently    Types: Marijuana     Allergies    Patient has no known allergies.   Review of Systems Review of Systems Pertinent negatives listed in HPI   Physical Exam Triage Vital Signs ED Triage Vitals [11/18/20 1029]  Enc Vitals Group     BP (!) 134/92     Pulse Rate 93     Resp 18     Temp 98.4 F (36.9 C)     Temp Source Oral     SpO2 99 %     Weight      Height      Head Circumference      Peak Flow      Pain Score 6     Pain Loc      Pain Edu?      Excl. in GC?    No data found.  Updated Vital Signs BP (!) 134/92 (BP Location: Left Arm)   Pulse 93   Temp 98.4 F (36.9 C) (Oral)   Resp 18   SpO2 99%   Visual Acuity Right Eye Distance:   Left Eye Distance:   Bilateral Distance:  Right Eye Near:   Left Eye Near:    Bilateral Near:     Physical Exam General appearance: alert, well developed, well nourished, cooperative  Head: Normocephalic, without obvious abnormality, atraumatic Respiratory: Respirations even and unlabored, normal respiratory rate Heart: rate and rhythm normal. No gallop or murmurs noted on exam  Abdomen: BS +, no distention, no rebound tenderness, no CVA tenderness Extremities: No gross deformities Skin: Skin color, texture, turgor normal. No rashes seen  Psych: Appropriate mood and affect. Neurologic:  Alert, oriented to person, place, and time, thought content appropriate. UC Treatments / Results  Labs (all labs ordered are listed, but only abnormal results are displayed) Labs Reviewed  POCT URINALYSIS DIP (MANUAL ENTRY) - Abnormal; Notable for the following components:      Result Value   Clarity, UA cloudy (*)    Ketones, POC UA trace (5) (*)    Blood, UA trace-intact (*)    Protein Ur, POC trace (*)    Leukocytes, UA Large (3+) (*)    All other components within normal limits  URINE CULTURE  POCT URINE PREGNANCY    EKG   Radiology No results found.  Procedures Procedures (including critical care time)  Medications Ordered in UC Medications - No data  to display  Initial Impression / Assessment and Plan / UC Course  I have reviewed the triage vital signs and the nursing notes.  Pertinent labs & imaging results that were available during my care of the patient were reviewed by me and considered in my medical decision making (see chart for details).    Patient with acute cystitis treated with Macrobid 100 mg twice daily for total 7 days.  Urine pregnancy negative urine culture pending.  Patient advised if any changes in treatment warranted after receipt of urine culture we will notify her via MyChart.  Encourage hydrating well with water.  Red flags discussed.  Patient verbalized understanding and agreement plan. Final Clinical Impressions(s) / UC Diagnoses   Final diagnoses:  Acute cystitis with hematuria     Discharge Instructions     Start Macrobid 100 mg twice daily and complete over the course the next 7 days.  Hydrate well with water. Urine culture is pending if any changes in antibiotics is warranted we will notify you via MyChart.    ED Prescriptions    Medication Sig Dispense Auth. Provider   nitrofurantoin, macrocrystal-monohydrate, (MACROBID) 100 MG capsule Take 1 capsule (100 mg total) by mouth 2 (two) times daily for 7 days. 14 capsule Bing Neighbors, FNP     PDMP not reviewed this encounter.   Bing Neighbors, FNP 11/18/20 1116

## 2020-11-19 ENCOUNTER — Ambulatory Visit: Payer: Medicaid Other | Admitting: Sports Medicine

## 2020-11-20 ENCOUNTER — Telehealth (HOSPITAL_COMMUNITY): Payer: Self-pay | Admitting: Emergency Medicine

## 2020-11-20 LAB — URINE CULTURE: Culture: >=100000 — AB

## 2020-11-20 MED ORDER — SULFAMETHOXAZOLE-TRIMETHOPRIM 800-160 MG PO TABS: 1.0000 | ORAL_TABLET | Freq: Two times a day (BID) | ORAL | 0 refills | Status: AC

## 2020-11-21 ENCOUNTER — Telehealth (HOSPITAL_COMMUNITY): Payer: Self-pay | Admitting: Emergency Medicine

## 2020-12-17 ENCOUNTER — Ambulatory Visit
Admission: EM | Admit: 2020-12-17 | Discharge: 2020-12-17 | Disposition: A | Discharge status: Home / Self Care | Payer: Medicaid Other | Attending: Family Medicine | Admitting: Family Medicine

## 2020-12-17 ENCOUNTER — Other Ambulatory Visit: Payer: Self-pay

## 2020-12-17 DIAGNOSIS — N760 Acute vaginitis: Secondary | ICD-10-CM | POA: Diagnosis not present

## 2020-12-17 MED ORDER — FLUCONAZOLE 150 MG PO TABS: ORAL_TABLET | ORAL | 0 refills | Status: DC

## 2020-12-17 NOTE — Discharge Instructions (Addendum)
I have sent in fluconazole in case of yeast. Take one tablet at the onset of symptoms. If symptoms are still present in 3 days, take the second tablet.   Follow up with this office or with primary care if symptoms are persisting.  Follow up in the ER for high fever, trouble swallowing, trouble breathing, other concerning symptoms.    

## 2020-12-17 NOTE — ED Triage Notes (Signed)
Pt states she developed yeast infection after antibiotics for uti

## 2020-12-19 NOTE — ED Provider Notes (Signed)
[facility]    CSN: [id number] Arrival date & time: 12/17/20  1508      History   Chief Complaint No chief complaint on file.   HPI Brenda Simon is a 30 y.o. female.   Reports vaginal itching and irritation with white vaginal discharge after completing antibiotic for UTI. Reports that urinary symptoms have improved. Denies odor, abnormal vaginal bleeding, vaginal lesions, STI exposure, other symptoms.  ROS per HPI  The history is provided by the patient.    Past Medical History:  Diagnosis Date  . Chlamydia   . Sickle cell trait [facility])     Patient Active Problem List   Diagnosis Date Noted  . Oligohydramnios 12/05/2018  . Depression 12/10/2012    Past Surgical History:  Procedure Laterality Date  . CESAREAN SECTION N/A 12/06/2018   Procedure: CESAREAN SECTION;  Surgeon: Levie Heritage, DO;  Location: [facility] LD ORS;  Service: Obstetrics;  Laterality: N/A;  . NO PAST SURGERIES      OB History    Gravida  1   Para  1   Term  1   Preterm      AB      Living  1     SAB      IAB      Ectopic      Multiple  0   Live Births  1            Home Medications    Prior to Admission medications   Medication Sig Start Date End Date Taking? Authorizing Provider  fluconazole (DIFLUCAN) 150 MG tablet Take one tablet at the onset of symptoms. If symptoms are still present 3 days later, take the second tablet. 12/17/20  Yes Moshe Cipro, NP  oxyCODONE-acetaminophen (PERCOCET) 5-325 MG tablet Take 1 tablet by mouth every 6 (six) hours as needed for severe pain. Patient not taking: Reported on 11/18/2020 05/23/20 05/23/21  Eustace Moore, MD    Family History Family History  Problem Relation Age of Onset  . Hypertension Mother     Social History Social History   Tobacco Use  . Smoking status: Former Smoker    Packs/day: 1.50    Types: Cigars  . Smokeless tobacco: Never Used  . Tobacco comment: stopped after pos. UPT  Vaping Use   . Vaping Use: Never used  Substance Use Topics  . Alcohol use: No  . Drug use: Not Currently    Types: Marijuana     Allergies   Patient has no known allergies.   Review of Systems Review of Systems   Physical Exam Triage Vital Signs ED Triage Vitals [12/17/20 1525]  Enc Vitals Group     BP      Pulse      Resp      Temp      Temp src      SpO2      Weight      Height      Head Circumference      Peak Flow      Pain Score 0     Pain Loc      Pain Edu?      Excl. in GC?    No data found.  Updated Vital Signs There were no vitals taken for this visit.    Physical Exam Vitals and nursing note reviewed.  Constitutional:      General: She is not in acute distress.    Appearance: Normal appearance.  She is well-developed.  HENT:     Head: Normocephalic and atraumatic.  Eyes:     Conjunctiva/sclera: Conjunctivae normal.  Cardiovascular:     Rate and Rhythm: Normal rate and regular rhythm.     Heart sounds: No murmur heard.   Pulmonary:     Effort: Pulmonary effort is normal. No respiratory distress.     Breath sounds: Normal breath sounds.  Abdominal:     Palpations: Abdomen is soft.     Tenderness: There is no abdominal tenderness.  Genitourinary:    Comments: Deferred  Musculoskeletal:        General: Normal range of motion.     Cervical back: Neck supple.  Skin:    General: Skin is warm and dry.     Capillary Refill: Capillary refill takes less than 2 seconds.  Neurological:     General: No focal deficit present.     Mental Status: She is alert and oriented to person, place, and time.  Psychiatric:        Mood and Affect: Mood normal.        Behavior: Behavior normal.        Thought Content: Thought content normal.      UC Treatments / Results  Labs (all labs ordered are listed, but only abnormal results are displayed) Labs Reviewed - No data to display  EKG   Radiology No results found.  Procedures Procedures (including  critical care time)  Medications Ordered in UC Medications - No data to display  Initial Impression / Assessment and Plan / UC Course  I have reviewed the triage vital signs and the nursing notes.  Pertinent labs & imaging results that were available during my care of the patient were reviewed by me and considered in my medical decision making (see chart for details).    Acute Vaginitis  Has hx vaginal yeast Will treat based on symptoms and recent antibiotic use Prescribed fluconazole Follow up as needed  Final Clinical Impressions(s) / UC Diagnoses   Final diagnoses:  Acute vaginitis     Discharge Instructions     I have sent in fluconazole in case of yeast. Take one tablet at the onset of symptoms. If symptoms are still present in 3 days, take the second tablet.   Follow up with this office or with primary care if symptoms are persisting.  Follow up in the ER for high fever, trouble swallowing, trouble breathing, other concerning symptoms.     ED Prescriptions    Medication Sig Dispense Auth. Provider   fluconazole (DIFLUCAN) 150 MG tablet Take one tablet at the onset of symptoms. If symptoms are still present 3 days later, take the second tablet. 2 tablet Moshe Cipro, NP     PDMP not reviewed this encounter.   Moshe Cipro, NP 12/19/20 1524

## 2021-01-20 ENCOUNTER — Other Ambulatory Visit: Payer: Self-pay

## 2021-01-20 ENCOUNTER — Encounter: Payer: Self-pay | Admitting: Emergency Medicine

## 2021-01-20 ENCOUNTER — Ambulatory Visit
Admission: EM | Admit: 2021-01-20 | Discharge: 2021-01-20 | Disposition: A | Discharge status: Home / Self Care | Payer: Medicaid Other | Attending: Emergency Medicine | Admitting: Emergency Medicine

## 2021-01-20 DIAGNOSIS — N760 Acute vaginitis: Secondary | ICD-10-CM | POA: Diagnosis not present

## 2021-01-20 MED ORDER — FLUCONAZOLE 150 MG PO TABS: ORAL_TABLET | ORAL | 0 refills | Status: DC

## 2021-01-20 NOTE — ED Triage Notes (Signed)
Pt here for vaginal discharge that is white per pt

## 2021-01-20 NOTE — ED Provider Notes (Signed)
[facility]    CSN: [id number] Arrival date & time: 01/20/21  1940      History   Chief Complaint Chief Complaint  Patient presents with  . Vaginal Discharge    HPI Brenda Simon is a 30 y.o. female.   Patient presents with concerns of white vaginal discharge for the past 2 days. She states it is white and clumpy. It is itchy as well. She denies urinary symptoms such as dysuria or frequency, or abdominal pain. The patient reports prior similar with yeast infection about a month ago and it cleared after the Diflucan they prescribed. She has not tried anything for it.   The history is provided by the patient.  Vaginal Discharge Quality:  White Associated symptoms: vaginal itching   Associated symptoms: no abdominal pain, no dysuria, no fever, no genital lesions, no nausea, no rash, no urinary frequency and no vomiting     Past Medical History:  Diagnosis Date  . Chlamydia   . Sickle cell trait [facility])     Patient Active Problem List   Diagnosis Date Noted  . Oligohydramnios 12/05/2018  . Depression 12/10/2012    Past Surgical History:  Procedure Laterality Date  . CESAREAN SECTION N/A 12/06/2018   Procedure: CESAREAN SECTION;  Surgeon: Levie Heritage, DO;  Location: [facility] LD ORS;  Service: Obstetrics;  Laterality: N/A;  . NO PAST SURGERIES      OB History    Gravida  1   Para  1   Term  1   Preterm      AB      Living  1     SAB      IAB      Ectopic      Multiple  0   Live Births  1            Home Medications    Prior to Admission medications   Medication Sig Start Date End Date Taking? Authorizing Provider  fluconazole (DIFLUCAN) 150 MG tablet Take one tablet. Take another if no improvement in 3 days. 01/20/21  Yes Vallery Sa, Nashay Brickley L, PA    Family History Family History  Problem Relation Age of Onset  . Hypertension Mother     Social History Social History   Tobacco Use  . Smoking status: Former Smoker    Packs/day:  1.50    Types: Cigars  . Smokeless tobacco: Never Used  . Tobacco comment: stopped after pos. UPT  Vaping Use  . Vaping Use: Never used  Substance Use Topics  . Alcohol use: No  . Drug use: Not Currently    Types: Marijuana     Allergies   Patient has no known allergies.   Review of Systems Review of Systems  Constitutional: Negative for fatigue and fever.  Gastrointestinal: Negative for abdominal pain, nausea and vomiting.  Genitourinary: Positive for vaginal discharge. Negative for difficulty urinating, dysuria, flank pain, frequency, genital sores, urgency and vaginal pain.  Musculoskeletal: Negative for back pain.  Skin: Negative for rash.  Neurological: Negative for headaches.     Physical Exam Triage Vital Signs ED Triage Vitals  Enc Vitals Group     BP 01/20/21 1951 130/71     Pulse Rate 01/20/21 1951 80     Resp 01/20/21 1951 18     Temp 01/20/21 1951 98 F (36.7 C)     Temp Source 01/20/21 1951 Oral     SpO2 01/20/21 1951 95 %  Weight --      Height --      Head Circumference --      Peak Flow --      Pain Score 01/20/21 1952 0     Pain Loc --      Pain Edu? --      Excl. in GC? --    No data found.  Updated Vital Signs BP 130/71 (BP Location: Left Arm)   Pulse 80   Temp 98 F (36.7 C) (Oral)   Resp 18   SpO2 95%   Visual Acuity Right Eye Distance:   Left Eye Distance:   Bilateral Distance:    Right Eye Near:   Left Eye Near:    Bilateral Near:     Physical Exam Vitals and nursing note reviewed.  Constitutional:      General: She is not in acute distress. HENT:     Head: Normocephalic.  Eyes:     Pupils: Pupils are equal, round, and reactive to light.  Cardiovascular:     Rate and Rhythm: Normal rate and regular rhythm.     Heart sounds: Normal heart sounds.  Pulmonary:     Effort: Pulmonary effort is normal.     Breath sounds: Normal breath sounds.  Abdominal:     Palpations: Abdomen is soft.     Tenderness: There is no  abdominal tenderness. There is no right CVA tenderness or left CVA tenderness.  Genitourinary:    Comments: Pt declined Skin:    Findings: No rash.  Neurological:     Mental Status: She is alert.  Psychiatric:        Mood and Affect: Mood normal.      UC Treatments / Results  Labs (all labs ordered are listed, but only abnormal results are displayed) Labs Reviewed  CERVICOVAGINAL ANCILLARY ONLY    EKG   Radiology No results found.  Procedures Procedures (including critical care time)  Medications Ordered in UC Medications - No data to display  Initial Impression / Assessment and Plan / UC Course  I have reviewed the triage vital signs and the nursing notes.  Pertinent labs & imaging results that were available during my care of the patient were reviewed by me and considered in my medical decision making (see chart for details).     S/s consistent with yeast infection, will empirically treat while awaiting lab results.   Final Clinical Impressions(s) / UC Diagnoses   Final diagnoses:  Vaginitis and vulvovaginitis     Discharge Instructions     Take Diflucan as prescribed for likely yeast infection. We are sending out labs and will let you know in a few days with results. If recurrent issues follow-up with gynecology.     ED Prescriptions    Medication Sig Dispense Auth. Provider   fluconazole (DIFLUCAN) 150 MG tablet Take one tablet. Take another if no improvement in 3 days. 2 tablet Vallery Sa, Kathalene Sporer L, PA     PDMP not reviewed this encounter.   Estanislado Pandy, [place] 01/20/21 2018

## 2021-01-20 NOTE — Discharge Instructions (Signed)
Take Diflucan as prescribed for likely yeast infection. We are sending out labs and will let you know in a few days with results. If recurrent issues follow-up with gynecology.

## 2021-01-22 LAB — CERVICOVAGINAL ANCILLARY ONLY
Bacterial Vaginitis (gardnerella): NEGATIVE
Candida Glabrata: NEGATIVE
Candida Vaginitis: POSITIVE — AB
Chlamydia: NEGATIVE
Comment: NEGATIVE
Comment: NEGATIVE
Comment: NEGATIVE
Comment: NEGATIVE
Comment: NEGATIVE
Comment: NORMAL
Neisseria Gonorrhea: NEGATIVE
Trichomonas: NEGATIVE

## 2021-08-05 ENCOUNTER — Other Ambulatory Visit: Payer: Self-pay

## 2021-08-05 ENCOUNTER — Encounter: Payer: Self-pay | Admitting: Physician Assistant

## 2021-08-05 ENCOUNTER — Ambulatory Visit
Admission: EM | Admit: 2021-08-05 | Discharge: 2021-08-05 | Disposition: A | Discharge status: Home / Self Care | Payer: Medicaid Other | Attending: Physician Assistant | Admitting: Physician Assistant

## 2021-08-05 DIAGNOSIS — J101 Influenza due to other identified influenza virus with other respiratory manifestations: Secondary | ICD-10-CM

## 2021-08-05 LAB — POCT INFLUENZA A/B
Influenza A, POC: POSITIVE — AB
Influenza B, POC: NEGATIVE

## 2021-08-05 MED ORDER — OSELTAMIVIR PHOSPHATE 75 MG PO CAPS: 75.0000 mg | ORAL_CAPSULE | Freq: Two times a day (BID) | ORAL | 0 refills | Status: DC

## 2021-08-05 MED ORDER — ACETAMINOPHEN 325 MG PO TABS
650.0000 mg | ORAL_TABLET | Freq: Once | ORAL | Status: AC
  Administered 2021-08-05: 650 mg via ORAL

## 2021-08-05 NOTE — ED Triage Notes (Signed)
Pt reports fever ,cough ,chills and body aches started on Sunday.

## 2021-08-05 NOTE — ED Provider Notes (Signed)
[facility]    CSN: [id number] Arrival date & time: 08/05/21  1548      History   Chief Complaint Chief Complaint  Patient presents with   Fever   Chills   Generalized Body Aches    HPI Brenda Simon is a 30 y.o. female.   Patient here today for evaluation of cough, congestion, fever and body aches that she has had for the last 2 days.  She reports she has had some diarrhea but no vomiting.  She has tried over-the-counter treatment without significant relief.  The history is provided by the patient.  Fever Associated symptoms: congestion, cough, diarrhea, myalgias and sore throat   Associated symptoms: no nausea and no vomiting    Past Medical History:  Diagnosis Date   Chlamydia    Sickle cell trait [facility])     Patient Active Problem List   Diagnosis Date Noted   Oligohydramnios 12/05/2018   Depression 12/10/2012    Past Surgical History:  Procedure Laterality Date   CESAREAN SECTION N/A 12/06/2018   Procedure: CESAREAN SECTION;  Surgeon: Levie Heritage, DO;  Location: [facility] LD ORS;  Service: Obstetrics;  Laterality: N/A;   NO PAST SURGERIES      OB History     Gravida  1   Para  1   Term  1   Preterm      AB      Living  1      SAB      IAB      Ectopic      Multiple  0   Live Births  1            Home Medications    Prior to Admission medications   Medication Sig Start Date End Date Taking? Authorizing Provider  oseltamivir (TAMIFLU) 75 MG capsule Take 1 capsule (75 mg total) by mouth every 12 (twelve) hours. 08/05/21  Yes Tomi Bamberger, PA-C  fluconazole (DIFLUCAN) 150 MG tablet Take one tablet. Take another if no improvement in 3 days. 01/20/21   Estanislado Pandy, PA    Family History Family History  Problem Relation Age of Onset   Hypertension Mother     Social History Social History   Tobacco Use   Smoking status: Former    Packs/day: 1.50    Types: Cigars, Cigarettes   Smokeless tobacco: Never    Tobacco comments:    stopped after pos. UPT  Vaping Use   Vaping Use: Never used  Substance Use Topics   Alcohol use: No   Drug use: Not Currently    Types: Marijuana     Allergies   Patient has no known allergies.   Review of Systems Review of Systems  Constitutional:  Positive for fever.  HENT:  Positive for congestion and sore throat.   Eyes:  Negative for discharge and redness.  Respiratory:  Positive for cough. Negative for shortness of breath.   Gastrointestinal:  Positive for diarrhea. Negative for nausea and vomiting.  Musculoskeletal:  Positive for myalgias.    Physical Exam Triage Vital Signs ED Triage Vitals [08/05/21 1636]  Enc Vitals Group     BP 119/75     Pulse Rate (!) 106     Resp 20     Temp (!) 101.6 F (38.7 C)     Temp src      SpO2 96 %     Weight      Height  Head Circumference      Peak Flow      Pain Score 8     Pain Loc      Pain Edu?      Excl. in GC?    No data found.  Updated Vital Signs BP 119/75   Pulse (!) 106   Temp (!) 101.6 F (38.7 C)   Resp 20   LMP 07/13/2021 (Approximate)   SpO2 96%   Breastfeeding No      Physical Exam Vitals and nursing note reviewed.  Constitutional:      General: She is not in acute distress.    Appearance: Normal appearance. She is not ill-appearing.  HENT:     Head: Normocephalic and atraumatic.     Nose: Congestion present.     Mouth/Throat:     Mouth: Mucous membranes are moist.     Pharynx: No oropharyngeal exudate or posterior oropharyngeal erythema.  Eyes:     Conjunctiva/sclera: Conjunctivae normal.  Cardiovascular:     Rate and Rhythm: Normal rate and regular rhythm.     Heart sounds: Normal heart sounds. No murmur heard. Pulmonary:     Effort: Pulmonary effort is normal. No respiratory distress.     Breath sounds: Normal breath sounds. No wheezing, rhonchi or rales.  Skin:    General: Skin is warm and dry.  Neurological:     Mental Status: She is alert.   Psychiatric:        Mood and Affect: Mood normal.        Thought Content: Thought content normal.     UC Treatments / Results  Labs (all labs ordered are listed, but only abnormal results are displayed) Labs Reviewed  POCT INFLUENZA A/B - Abnormal; Notable for the following components:      Result Value   Influenza A, POC Positive (*)    All other components within normal limits    EKG   Radiology No results found.  Procedures Procedures (including critical care time)  Medications Ordered in UC Medications  acetaminophen (TYLENOL) tablet 650 mg (650 mg Oral Given 08/05/21 1642)    Initial Impression / Assessment and Plan / UC Course  I have reviewed the triage vital signs and the nursing notes.  Pertinent labs & imaging results that were available during my care of the patient were reviewed by me and considered in my medical decision making (see chart for details).  Flu test positive in office.  Will treat with Tamiflu and encouraged continued symptomatic treatment.  Encouraged follow-up with any further concerns.  Final Clinical Impressions(s) / UC Diagnoses   Final diagnoses:  Influenza A   Discharge Instructions   None    ED Prescriptions     Medication Sig Dispense Auth. Provider   oseltamivir (TAMIFLU) 75 MG capsule Take 1 capsule (75 mg total) by mouth every 12 (twelve) hours. 10 capsule Tomi Bamberger, PA-C      PDMP not reviewed this encounter.   Tomi Bamberger, PA-C 08/05/21 1757

## 2021-11-13 ENCOUNTER — Ambulatory Visit
Admission: EM | Admit: 2021-11-13 | Discharge: 2021-11-13 | Disposition: A | Discharge status: Home / Self Care | Payer: Medicaid Other | Attending: Internal Medicine | Admitting: Internal Medicine

## 2021-11-13 ENCOUNTER — Other Ambulatory Visit: Payer: Self-pay

## 2021-11-13 ENCOUNTER — Encounter: Payer: Self-pay | Admitting: Emergency Medicine

## 2021-11-13 DIAGNOSIS — M25532 Pain in left wrist: Secondary | ICD-10-CM | POA: Diagnosis not present

## 2021-11-13 MED ORDER — IBUPROFEN 600 MG PO TABS: 600.0000 mg | ORAL_TABLET | Freq: Four times a day (QID) | ORAL | 0 refills | Status: DC | PRN

## 2021-11-13 NOTE — ED Provider Notes (Addendum)
[facility]    CSN: [id number] Arrival date & time: 11/13/21  1005      History   Chief Complaint Chief Complaint  Patient presents with   Wrist Pain    HPI Brenda Simon is a 31 y.o. female.   Patient presents with left wrist pain/forearm pain that started yesterday.  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  She noticed that she was having some wrist pain during work yesterday.  Movement exacerbates pain.  Pain is present in the left dorsal forearm and extends to left thumb.  Has full range of motion but with pain.  Denies any numbness or tingling.  Patient does not report taking any medications for pain.   Wrist Pain   Past Medical History:  Diagnosis Date   Chlamydia    Sickle cell trait [facility])     Patient Active Problem List   Diagnosis Date Noted   Oligohydramnios 12/05/2018   Depression 12/10/2012    Past Surgical History:  Procedure Laterality Date   CESAREAN SECTION N/A 12/06/2018   Procedure: CESAREAN SECTION;  Surgeon: Truett Mainland, DO;  Location: [facility] LD ORS;  Service: Obstetrics;  Laterality: N/A;   NO PAST SURGERIES      OB History     Gravida  1   Para  1   Term  1   Preterm      AB      Living  1      SAB      IAB      Ectopic      Multiple  0   Live Births  1            Home Medications    Prior to Admission medications   Medication Sig Start Date End Date Taking? Authorizing Provider  ibuprofen (ADVIL) 600 MG tablet Take 1 tablet (600 mg total) by mouth every 6 (six) hours as needed for mild pain. 11/13/21  Yes Damier Disano, Hildred Alamin E, FNP  fluconazole (DIFLUCAN) 150 MG tablet Take one tablet. Take another if no improvement in 3 days. 01/20/21   Abner Greenspan, Amy L, PA  oseltamivir (TAMIFLU) 75 MG capsule Take 1 capsule (75 mg total) by mouth every 12 (twelve) hours. 08/05/21   Francene Finders, PA-C    Family History Family History  Problem Relation Age of Onset   Hypertension  Mother     Social History Social History   Tobacco Use   Smoking status: Former    Packs/day: 1.50    Types: Cigars, Cigarettes   Smokeless tobacco: Never   Tobacco comments:    stopped after pos. UPT  Vaping Use   Vaping Use: Never used  Substance Use Topics   Alcohol use: No   Drug use: Not Currently    Types: Marijuana     Allergies   Patient has no known allergies.   Review of Systems Review of Systems Per HPI  Physical Exam Triage Vital Signs ED Triage Vitals [11/13/21 1142]  Enc Vitals Group     BP 131/77     Pulse Rate 74     Resp 16     Temp 98.6 F (37 C)     Temp Source Oral     SpO2 97 %     Weight      Height      Head Circumference      Peak Flow      Pain Score  8     Pain Loc      Pain Edu?      Excl. in [place]?    No data found.  Updated Vital Signs BP 131/77 (BP Location: Left Arm)    Pulse 74    Temp 98.6 F (37 C) (Oral)    Resp 16    SpO2 97%   Visual Acuity Right Eye Distance:   Left Eye Distance:   Bilateral Distance:    Right Eye Near:   Left Eye Near:    Bilateral Near:     Physical Exam Constitutional:      General: She is not in acute distress.    Appearance: Normal appearance. She is not toxic-appearing or diaphoretic.  HENT:     Head: Normocephalic and atraumatic.  Eyes:     Extraocular Movements: Extraocular movements intact.     Conjunctiva/sclera: Conjunctivae normal.  Pulmonary:     Effort: Pulmonary effort is normal.  Musculoskeletal:       Arms:     Comments: Tenderness to palpation with mild swelling noted to left wrist/anterior forearm.  Also has tenderness with range of motion of left thumb.  Has full range of motion of wrist and thumb but with pain.  Neurovascular intact.  Grip strength 5/5.  Capillary refill normal.  Neurological:     General: No focal deficit present.     Mental Status: She is alert and oriented to person, place, and time. Mental status is at baseline.  Psychiatric:        Mood and  Affect: Mood normal.        Behavior: Behavior normal.        Thought Content: Thought content normal.        Judgment: Judgment normal.     UC Treatments / Results  Labs (all labs ordered are listed, but only abnormal results are displayed) Labs Reviewed - No data to display  EKG   Radiology No results found.  Procedures Procedures (including critical care time)  Medications Ordered in UC Medications - No data to display  Initial Impression / Assessment and Plan / UC Course  I have reviewed the triage vital signs and the nursing notes.  Pertinent labs & imaging results that were available during my care of the patient were reviewed by me and considered in my medical decision making (see chart for details).     Exam appears consistent with muscular injury.  Suspect left wrist sprain.  Discussed supportive care and ice application.  Brace applied in urgent care.  Ibuprofen prescribed to help alleviate pain and inflammation.  Patient to follow-up with provided contact information for orthopedist if pain persist or worsens.  Do not think that imaging is necessary given no apparent impact injury and no bony tenderness.  Discussed return precautions.  Patient verbalized understanding and was agreeable with plan. Final Clinical Impressions(s) / UC Diagnoses   Final diagnoses:  Left wrist pain     Discharge Instructions      It appears that you have a muscular strain.  You have been prescribed ibuprofen to help decrease pain and inflammation.  Please also use ice application.  An Ace wrap has been applied.  Please do not sleep in this.  Follow-up with orthopedist at provided contact info.  Please avoid repetitive movements for the next few days.    ED Prescriptions     Medication Sig Dispense Auth. Provider   ibuprofen (ADVIL) 600 MG tablet Take 1 tablet (600 mg  total) by mouth every 6 (six) hours as needed for mild pain. 30 tablet [place], Michele Rockers, [place]      PDMP not  reviewed this encounter.   Teodora Medici, [place] 11/13/21 [place], [place],  11/13/21 1207

## 2021-11-13 NOTE — Discharge Instructions (Addendum)
It appears that you have a muscular strain.  You have been prescribed ibuprofen to help decrease pain and inflammation.  Please also use ice application.  An Ace wrap has been applied.  Please do not sleep in this.  Follow-up with orthopedist at provided contact info.  Please avoid repetitive movements for the next few days.

## 2021-11-13 NOTE — ED Triage Notes (Signed)
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 Yesterday felt a muscle pull in her forearm, left side. Now states she still feels a pain/tension with movement, as well as numbness/tingling in her fingers initially that has improved. Good radial/ulnar pulses, brisk cap refill, fingers warm, full active ROM with the exception of her thumb, which is restricted due to pain

## 2021-12-26 ENCOUNTER — Other Ambulatory Visit: Payer: Self-pay

## 2021-12-26 ENCOUNTER — Ambulatory Visit (INDEPENDENT_AMBULATORY_CARE_PROVIDER_SITE_OTHER): Payer: Medicaid Other

## 2021-12-26 ENCOUNTER — Ambulatory Visit
Admission: EM | Admit: 2021-12-26 | Discharge: 2021-12-26 | Disposition: A | Discharge status: Home / Self Care | Payer: Medicaid Other | Attending: Physician Assistant | Admitting: Physician Assistant

## 2021-12-26 DIAGNOSIS — M79672 Pain in left foot: Secondary | ICD-10-CM

## 2021-12-26 MED ORDER — PREDNISONE 20 MG PO TABS: 40.0000 mg | ORAL_TABLET | Freq: Every day | ORAL | 0 refills | Status: AC

## 2021-12-26 NOTE — ED Triage Notes (Signed)
2 day h/o of left foot pain that started while Pt was walking at work. Pain with applying pressure, rotating her foot outward and when she reaches her arms above her head. Localizes pain to the lateral surface with some pain radiating into her ankle. ?Resting alleviates sxs. No meds taken. Confirms swelling. Denies bruising.  ?Notes some tingling in her 5th digit. Denies numbness.  ?No falls or injuries. Pt works in a Naval architect. ?

## 2021-12-26 NOTE — ED Provider Notes (Signed)
?[facility] ? ? ? ?CSN: [id number] ?Arrival date & time: 12/26/21  1032 ? ? ?  ? ?History   ?Chief Complaint ?Chief Complaint  ?Patient presents with  ? Foot Pain  ?  left  ? ? ?HPI ?Brenda Simon is a 31 y.o. female.  ? ?Here today for evaluation of left foot pain that started while she was walking at work a few days ago.  She reports that applying pressure rotating her foot outwards increases pain.  Resting does seem to help relieve symptoms.  She has not taken any medication.  She does report some swelling to the lateral aspect of her foot.  She has not had numbness but does report some tingling into her left fifth toe.  She denies any known falls or injuries. ? ?The history is provided by the patient.  ? ?Past Medical History:  ?Diagnosis Date  ? Chlamydia   ? Sickle cell trait (HCC)   ? ? ?Patient Active Problem List  ? Diagnosis Date Noted  ? Oligohydramnios 12/05/2018  ? Depression 12/10/2012  ? ? ?Past Surgical History:  ?Procedure Laterality Date  ? CESAREAN SECTION N/A 12/06/2018  ? Procedure: CESAREAN SECTION;  Surgeon: Levie Heritage, DO;  Location: [facility] LD [facility]S;  Service: Obstetrics;  Laterality: N/A;  ? NO PAST SURGERIES    ? ? ?OB History   ? ? Gravida  ?1  ? Para  ?1  ? Term  ?1  ? Preterm  ?   ? AB  ?   ? Living  ?1  ?  ? ? SAB  ?   ? IAB  ?   ? Ectopic  ?   ? Multiple  ?0  ? Live Births  ?1  ?   ?  ?  ? ? ? ?Home Medications   ? ?Prior to Admission medications   ?Medication Sig Start Date End Date Taking? Authorizing Provider  ?predniSONE (DELTASONE) 20 MG tablet Take 2 tablets (40 mg total) by mouth daily with breakfast for 5 days. 12/26/21 12/31/21 Yes Tomi Bamberger, PA-C  ?fluconazole (DIFLUCAN) 150 MG tablet Take one tablet. Take another if no improvement in 3 days. 01/20/21   Vallery Sa, Amy L, PA  ?ibuprofen (ADVIL) 600 MG tablet Take 1 tablet (600 mg total) by mouth every 6 (six) hours as needed for mild pain. 11/13/21   Gustavus Bryant, FNP  ?oseltamivir (TAMIFLU) 75 MG capsule Take 1  capsule (75 mg total) by mouth every 12 (twelve) hours. 08/05/21   Tomi Bamberger, PA-C  ? ? ?Family History ?Family History  ?Problem Relation Age of Onset  ? Hypertension Mother   ? ? ?Social History ?Social History  ? ?Tobacco Use  ? Smoking status: Former  ?  Packs/day: 1.50  ?  Types: Cigars, Cigarettes  ? Smokeless tobacco: Never  ? Tobacco comments:  ?  stopped after pos. UPT  ?Vaping Use  ? Vaping Use: Never used  ?Substance Use Topics  ? Alcohol use: No  ? Drug use: Not Currently  ?  Types: Marijuana  ? ? ? ?Allergies   ?Patient has no known allergies. ? ? ?Review of Systems ?Review of Systems  ?Constitutional:  Negative for chills and fever.  ?Eyes:  Negative for discharge and redness.  ?Gastrointestinal:  Negative for abdominal pain, nausea and vomiting.  ?Musculoskeletal:  Positive for arthralgias.  ?Skin:  Negative for color change.  ?Neurological:  Negative for numbness.  ? ? ?Physical Exam ?Triage Vital  Signs ?ED Triage Vitals  ?Enc Vitals Group  ?   BP   ?   Pulse   ?   Resp   ?   Temp   ?   Temp src   ?   SpO2   ?   Weight   ?   Height   ?   Head Circumference   ?   Peak Flow   ?   Pain Score   ?   Pain Loc   ?   Pain Edu?   ?   Excl. in [place]?   ? ?No data found. ? ?Updated Vital Signs ?BP 120/78 (BP Location: Left Arm)   Pulse 88   Temp 98 ?F (36.7 ?C) (Oral)   Resp 17   SpO2 98%  ?   ? ?Physical Exam ?Vitals and nursing note reviewed.  ?Constitutional:   ?   General: She is not in acute distress. ?   Appearance: Normal appearance. She is not ill-appearing.  ?HENT:  ?   Head: Normocephalic and atraumatic.  ?Eyes:  ?   Conjunctiva/sclera: Conjunctivae normal.  ?Cardiovascular:  ?   Rate and Rhythm: Normal rate.  ?Pulmonary:  ?   Effort: Pulmonary effort is normal.  ?Musculoskeletal:  ?   Comments: Mild swelling noted to lateral aspect of left foot, normal ROM of left toes, left ankle  ?Neurological:  ?   Mental Status: She is alert.  ?   Comments: Gross sensation intact to left toes   ?Psychiatric:     ?   Mood and Affect: Mood normal.     ?   Behavior: Behavior normal.     ?   Thought Content: Thought content normal.  ? ? ? ?UC Treatments / Results  ?Labs ?(all labs ordered are listed, but only abnormal results are displayed) ?Labs Reviewed - No data to display ? ?EKG ? ? ?Radiology ?DG Foot Complete Left ? ?Result Date: 12/26/2021 ?CLINICAL DATA:  Two day history of left heel pain started while walking at work. EXAM: LEFT FOOT - COMPLETE 3+ VIEW COMPARISON:  None. FINDINGS: No fracture or bone lesion. Joints are normally spaced and aligned.  No arthropathic changes. Normal soft tissues. IMPRESSION: Negative. Electronically Signed   By: Amie Portland M.D.   On: 12/26/2021 11:03   ? ?Procedures ?Procedures (including critical care time) ? ?Medications Ordered in UC ?Medications - No data to display ? ?Initial Impression / Assessment and Plan / UC Course  ?I have reviewed the triage vital signs and the nursing notes. ? ?Pertinent labs & imaging results that were available during my care of the patient were reviewed by me and considered in my medical decision making (see chart for details). ? ?  ?Some concern for stress fracture given presentation but xray was normal. Will treat with steroid burst and recommend follow up if no gradual improvement or if symptoms worsen.  ? ?Final Clinical Impressions(s) / UC Diagnoses  ? ?Final diagnoses:  ?Left foot pain  ? ?Discharge Instructions   ?None ?  ? ?ED Prescriptions   ? ? Medication Sig Dispense Auth. Provider  ? predniSONE (DELTASONE) 20 MG tablet Take 2 tablets (40 mg total) by mouth daily with breakfast for 5 days. 10 tablet Tomi Bamberger, PA-C  ? ?  ? ?PDMP not reviewed this encounter. ?  ?Tomi Bamberger, PA-C ?12/26/21 1135 ? ?

## 2021-12-30 ENCOUNTER — Other Ambulatory Visit: Payer: Self-pay

## 2021-12-30 ENCOUNTER — Ambulatory Visit
Admission: EM | Admit: 2021-12-30 | Discharge: 2021-12-30 | Disposition: A | Discharge status: Home / Self Care | Payer: Medicaid Other | Attending: Internal Medicine | Admitting: Internal Medicine

## 2021-12-30 ENCOUNTER — Encounter: Payer: Self-pay | Admitting: Emergency Medicine

## 2021-12-30 DIAGNOSIS — J069 Acute upper respiratory infection, unspecified: Secondary | ICD-10-CM

## 2021-12-30 MED ORDER — FLUTICASONE PROPIONATE 50 MCG/ACT NA SUSP: 1.0000 | Freq: Every day | NASAL | 0 refills | Status: DC

## 2021-12-30 MED ORDER — BENZONATATE 100 MG PO CAPS: 100.0000 mg | ORAL_CAPSULE | Freq: Three times a day (TID) | ORAL | 0 refills | Status: DC | PRN

## 2021-12-30 NOTE — Discharge Instructions (Signed)
It appears that you have a viral upper respiratory infection that should run its course and self resolve in the next few days.  You have been prescribed 2 medications to alleviate symptoms.  COVID-19, flu, RSV test is pending.  We will call if it is positive. ?

## 2021-12-30 NOTE — ED Provider Notes (Signed)
?[facility] ? ? ? ?CSN: [id number] ?Arrival date & time: 12/30/21  1008 ? ? ?  ? ?History   ?Chief Complaint ?Chief Complaint  ?Patient presents with  ? Nasal Congestion  ? ? ?HPI ?Brenda Simon is a 31 y.o. female.  ? ?Patient presents with nasal congestion, cough, chills that has been present for 3 days.  Denies any known fevers.  Child has had similar symptoms but patient reports that child has been sick for several weeks.  Denies chest pain, shortness of breath, sore throat, ear pain, nausea, vomiting, diarrhea, abdominal pain.  Patient has taken over-the-counter cold and flu medication with minimal improvement in symptoms. ? ? ? ?Past Medical History:  ?Diagnosis Date  ? Chlamydia   ? Sickle cell trait (HCC)   ? ? ?Patient Active Problem List  ? Diagnosis Date Noted  ? Oligohydramnios 12/05/2018  ? Depression 12/10/2012  ? ? ?Past Surgical History:  ?Procedure Laterality Date  ? CESAREAN SECTION N/A 12/06/2018  ? Procedure: CESAREAN SECTION;  Surgeon: Levie Heritage, DO;  Location: [facility] LD ORS;  Service: Obstetrics;  Laterality: N/A;  ? NO PAST SURGERIES    ? ? ?OB History   ? ? Gravida  ?1  ? Para  ?1  ? Term  ?1  ? Preterm  ?   ? AB  ?   ? Living  ?1  ?  ? ? SAB  ?   ? IAB  ?   ? Ectopic  ?   ? Multiple  ?0  ? Live Births  ?1  ?   ?  ?  ? ? ? ?Home Medications   ? ?Prior to Admission medications   ?Medication Sig Start Date End Date Taking? Authorizing Provider  ?benzonatate (TESSALON) 100 MG capsule Take 1 capsule (100 mg total) by mouth every 8 (eight) hours as needed for cough. 12/30/21  Yes Gustavus Bryant, FNP  ?fluticasone (FLONASE) 50 MCG/ACT nasal spray Place 1 spray into both nostrils daily for 3 days. 12/30/21 01/02/22 Yes Gustavus Bryant, FNP  ?fluconazole (DIFLUCAN) 150 MG tablet Take one tablet. Take another if no improvement in 3 days. ?Patient not taking: Reported on 12/30/2021 01/20/21   Wess Botts L, PA  ?ibuprofen (ADVIL) 600 MG tablet Take 1 tablet (600 mg total) by mouth every 6  (six) hours as needed for mild pain. 11/13/21   Gustavus Bryant, FNP  ?oseltamivir (TAMIFLU) 75 MG capsule Take 1 capsule (75 mg total) by mouth every 12 (twelve) hours. ?Patient not taking: Reported on 12/30/2021 08/05/21   Tomi Bamberger, PA-C  ?predniSONE (DELTASONE) 20 MG tablet Take 2 tablets (40 mg total) by mouth daily with breakfast for 5 days. ?Patient not taking: Reported on 12/30/2021 12/26/21 12/31/21  Tomi Bamberger, PA-C  ? ? ?Family History ?Family History  ?Problem Relation Age of Onset  ? Hypertension Mother   ? ? ?Social History ?Social History  ? ?Tobacco Use  ? Smoking status: Former  ?  Packs/day: 1.50  ?  Types: Cigars, Cigarettes  ? Smokeless tobacco: Never  ? Tobacco comments:  ?  stopped after pos. UPT  ?Vaping Use  ? Vaping Use: Never used  ?Substance Use Topics  ? Alcohol use: No  ? Drug use: Not Currently  ?  Types: Marijuana  ? ? ? ?Allergies   ?Patient has no known allergies. ? ? ?Review of Systems ?Review of Systems ?Per HPI ? ?Physical Exam ?Triage Vital Signs ?ED Triage  Vitals  ?Enc Vitals Group  ?   BP 12/30/21 1048 119/74  ?   Pulse Rate 12/30/21 1048 75  ?   Resp 12/30/21 1048 18  ?   Temp 12/30/21 1048 98.2 ?F (36.8 ?C)  ?   Temp Source 12/30/21 1048 Oral  ?   SpO2 12/30/21 1048 98 %  ?   Weight --   ?   Height --   ?   Head Circumference --   ?   Peak Flow --   ?   Pain Score 12/30/21 1049 2  ?   Pain Loc --   ?   Pain Edu? --   ?   Excl. in GC? --   ? ?No data found. ? ?Updated Vital Signs ?BP 119/74 (BP Location: Left Arm)   Pulse 75   Temp 98.2 ?F (36.8 ?C) (Oral)   Resp 18   SpO2 98%  ? ?Visual Acuity ?Right Eye Distance:   ?Left Eye Distance:   ?Bilateral Distance:   ? ?Right Eye Near:   ?Left Eye Near:    ?Bilateral Near:    ? ?Physical Exam ?Constitutional:   ?   General: She is not in acute distress. ?   Appearance: Normal appearance. She is not toxic-appearing or diaphoretic.  ?HENT:  ?   Head: Normocephalic and atraumatic.  ?   Right Ear: Tympanic membrane and ear  canal normal.  ?   Left Ear: Tympanic membrane and ear canal normal.  ?   Nose: Congestion present.  ?   Mouth/Throat:  ?   Mouth: Mucous membranes are moist.  ?   Pharynx: No posterior oropharyngeal erythema.  ?Eyes:  ?   Extraocular Movements: Extraocular movements intact.  ?   Conjunctiva/sclera: Conjunctivae normal.  ?   Pupils: Pupils are equal, round, and reactive to light.  ?Cardiovascular:  ?   Rate and Rhythm: Normal rate and regular rhythm.  ?   Pulses: Normal pulses.  ?   Heart sounds: Normal heart sounds.  ?Pulmonary:  ?   Effort: Pulmonary effort is normal. No respiratory distress.  ?   Breath sounds: Normal breath sounds. No stridor. No wheezing, rhonchi or rales.  ?Abdominal:  ?   General: Abdomen is flat. Bowel sounds are normal.  ?   Palpations: Abdomen is soft.  ?Musculoskeletal:     ?   General: Normal range of motion.  ?   Cervical back: Normal range of motion.  ?Skin: ?   General: Skin is warm and dry.  ?Neurological:  ?   General: No focal deficit present.  ?   Mental Status: She is alert and oriented to person, place, and time. Mental status is at baseline.  ?Psychiatric:     ?   Mood and Affect: Mood normal.     ?   Behavior: Behavior normal.  ? ? ? ?UC Treatments / Results  ?Labs ?(all labs ordered are listed, but only abnormal results are displayed) ?Labs Reviewed  ?COVID-19, FLU A+B AND RSV  ? ? ?EKG ? ? ?Radiology ?No results found. ? ?Procedures ?Procedures (including critical care time) ? ?Medications Ordered in UC ?Medications - No data to display ? ?Initial Impression / Assessment and Plan / UC Course  ?I have reviewed the triage vital signs and the nursing notes. ? ?Pertinent labs & imaging results that were available during my care of the patient were reviewed by me and considered in my medical decision making (see chart for details). ? ?  ? ?  Patient presents with symptoms likely from a viral upper respiratory infection. Differential includes bacterial pneumonia, sinusitis, allergic  rhinitis, COVID-19, flu, RSV. Do not suspect underlying cardiopulmonary process. Symptoms seem unlikely related to ACS, CHF or COPD exacerbations, pneumonia, pneumothorax. Patient is nontoxic appearing and not in need of emergent medical intervention.  Viral testing pending. ? ?Recommended symptom control with over the counter medications: Daily oral anti-histamine, Oral decongestant or IN corticosteroid, saline irrigations, cepacol lozenges, Robitussin, Delsym, honey tea.  Patient sent prescriptions. ? ?Return if symptoms fail to improve in 1-2 weeks or you develop shortness of breath, chest pain, severe headache. Patient states understanding and is agreeable. ? ?Discharged with PCP followup.  ?Final Clinical Impressions(s) / UC Diagnoses  ? ?Final diagnoses:  ?Viral upper respiratory tract infection with cough  ? ? ? ?Discharge Instructions   ? ?  ?It appears that you have a viral upper respiratory infection that should run its course and self resolve in the next few days.  You have been prescribed 2 medications to alleviate symptoms.  COVID-19, flu, RSV test is pending.  We will call if it is positive. ? ? ? ?ED Prescriptions   ? ? Medication Sig Dispense Auth. Provider  ? fluticasone (FLONASE) 50 MCG/ACT nasal spray Place 1 spray into both nostrils daily for 3 days. 16 g Ervin KnackMound, Joyous Gleghorn E, [place]  ? benzonatate (TESSALON) 100 MG capsule Take 1 capsule (100 mg total) by mouth every 8 (eight) hours as needed for cough. 21 capsule Ervin KnackMound, Roylene Heaton E, [place]  ? ?  ? ?PDMP not reviewed this encounter. ?  ?Gustavus BryantMound, Ajdin Macke E, [place] ?12/30/21 1111 ? ?

## 2021-12-30 NOTE — ED Triage Notes (Signed)
Pt here for nasal congestion with some cough and chills x 3 days ?

## 2021-12-31 LAB — COVID-19, FLU A+B AND RSV
Influenza A, NAA: NOT DETECTED
Influenza B, NAA: NOT DETECTED
RSV, NAA: NOT DETECTED
SARS-CoV-2, NAA: NOT DETECTED

## 2022-09-16 ENCOUNTER — Ambulatory Visit
Admission: EM | Admit: 2022-09-16 | Discharge: 2022-09-16 | Disposition: A | Discharge status: Home / Self Care | Payer: Medicaid Other | Attending: Internal Medicine | Admitting: Internal Medicine

## 2022-09-16 DIAGNOSIS — J069 Acute upper respiratory infection, unspecified: Secondary | ICD-10-CM

## 2022-09-16 DIAGNOSIS — Z20828 Contact with and (suspected) exposure to other viral communicable diseases: Secondary | ICD-10-CM | POA: Diagnosis not present

## 2022-09-16 MED ORDER — BENZONATATE 100 MG PO CAPS: 100.0000 mg | ORAL_CAPSULE | Freq: Three times a day (TID) | ORAL | 0 refills | Status: DC | PRN

## 2022-09-16 MED ORDER — FLUTICASONE PROPIONATE 50 MCG/ACT NA SUSP: 1.0000 | Freq: Every day | NASAL | 0 refills | Status: DC

## 2022-09-16 MED ORDER — PROMETHAZINE-DM 6.25-15 MG/5ML PO SYRP: 5.0000 mL | ORAL_SOLUTION | Freq: Every evening | ORAL | 0 refills | Status: DC | PRN

## 2022-09-16 NOTE — Discharge Instructions (Signed)
Highly suspicious that you have RSV given that your daughter has this.  I have prescribed you a few medications to alleviate symptoms as that should run its course and is treated symptomatically.  Promethazine DM is taken at night prior to going to bed as it can cause drowsiness.  Follow-up if symptoms persist or worsen.

## 2022-09-16 NOTE — ED Provider Notes (Signed)
[facility]    CSN: [id number] Arrival date & time: 09/16/22  1155      History   Chief Complaint Chief Complaint  Patient presents with   Cough   Nasal Congestion    HPI Brenda Simon is a 31 y.o. female.   Patient presents with cough, headache, runny nose, nasal congestion that started about 4 to 5 days ago.  Patient reports that her daughter recently tested positive for RSV.  Patient has taken several over-the-counter cold and flu medications with minimal improvement in symptoms.  She denies any known fevers at home.  Denies chest pain, shortness of breath, sore throat, ear pain, nausea, vomiting, diarrhea, abdominal pain.   Cough   Past Medical History:  Diagnosis Date   Chlamydia    Sickle cell trait [place])     Patient Active Problem List   Diagnosis Date Noted   Oligohydramnios 12/05/2018   Depression 12/10/2012    Past Surgical History:  Procedure Laterality Date   CESAREAN SECTION N/A 12/06/2018   Procedure: CESAREAN SECTION;  Surgeon: Truett Mainland, DO;  Location: [facility];  Service: Obstetrics;  Laterality: N/A;   NO PAST SURGERIES      OB History     Gravida  1   Para  1   Term  1   Preterm      AB      Living  1      SAB      IAB      Ectopic      Multiple  0   Live Births  1            Home Medications    Prior to Admission medications   Medication Sig Start Date End Date Taking? Authorizing Provider  benzonatate (TESSALON) 100 MG capsule Take 1 capsule (100 mg total) by mouth every 8 (eight) hours as needed for cough. 09/16/22  Yes Chares Slaymaker, Hildred Alamin E, FNP  fluticasone (FLONASE) 50 MCG/ACT nasal spray Place 1 spray into both nostrils daily. 09/16/22  Yes Dariel Betzer, Michele Rockers, FNP  promethazine-dextromethorphan (PROMETHAZINE-DM) 6.25-15 MG/5ML syrup Take 5 mLs by mouth at bedtime as needed for cough. 09/16/22  Yes Chinelo Benn, Hildred Alamin E, FNP  fluconazole (DIFLUCAN) 150 MG tablet Take one tablet. Take another if no improvement  in 3 days. Patient not taking: Reported on 12/30/2021 01/20/21   Abner Greenspan, Amy L, PA  ibuprofen (ADVIL) 600 MG tablet Take 1 tablet (600 mg total) by mouth every 6 (six) hours as needed for mild pain. 11/13/21   Teodora Medici, FNP  oseltamivir (TAMIFLU) 75 MG capsule Take 1 capsule (75 mg total) by mouth every 12 (twelve) hours. Patient not taking: Reported on 12/30/2021 08/05/21   Francene Finders, PA-C    Family History Family History  Problem Relation Age of Onset   Hypertension Mother     Social History Social History   Tobacco Use   Smoking status: Former    Packs/day: 1.50    Types: Cigars, Cigarettes   Smokeless tobacco: Never   Tobacco comments:    stopped after pos. UPT  Vaping Use   Vaping Use: Never used  Substance Use Topics   Alcohol use: No   Drug use: Not Currently    Types: Marijuana     Allergies   Patient has no known allergies.   Review of Systems Review of Systems Per HPI  Physical Exam Triage Vital Signs ED Triage Vitals  Enc Vitals Group  BP 09/16/22 1355 (!) 137/90     Pulse Rate 09/16/22 1355 82     Resp 09/16/22 1355 16     Temp 09/16/22 1355 97.8 F (36.6 C)     Temp src --      SpO2 09/16/22 1355 98 %     Weight --      Height --      Head Circumference --      Peak Flow --      Pain Score 09/16/22 1354 4     Pain Loc --      Pain Edu? --      Excl. in [place]? --    No data found.  Updated Vital Signs BP (!) 137/90   Pulse 82   Temp 97.8 F (36.6 C)   Resp 16   LMP  (LMP Unknown)   SpO2 98%   Visual Acuity Right Eye Distance:   Left Eye Distance:   Bilateral Distance:    Right Eye Near:   Left Eye Near:    Bilateral Near:     Physical Exam Constitutional:      General: She is not in acute distress.    Appearance: Normal appearance. She is not toxic-appearing or diaphoretic.  HENT:     Head: Normocephalic and atraumatic.     Right Ear: Tympanic membrane and ear canal normal.     Left Ear: Tympanic membrane and  ear canal normal.     Nose: Congestion present.     Mouth/Throat:     Mouth: Mucous membranes are moist.     Pharynx: No posterior oropharyngeal erythema.  Eyes:     Extraocular Movements: Extraocular movements intact.     Conjunctiva/sclera: Conjunctivae normal.     Pupils: Pupils are equal, round, and reactive to light.  Cardiovascular:     Rate and Rhythm: Normal rate and regular rhythm.     Pulses: Normal pulses.     Heart sounds: Normal heart sounds.  Pulmonary:     Effort: Pulmonary effort is normal. No respiratory distress.     Breath sounds: Normal breath sounds. No stridor. No wheezing, rhonchi or rales.  Abdominal:     General: Abdomen is flat. Bowel sounds are normal.     Palpations: Abdomen is soft.  Musculoskeletal:        General: Normal range of motion.     Cervical back: Normal range of motion.  Skin:    General: Skin is warm and dry.  Neurological:     General: No focal deficit present.     Mental Status: She is alert and oriented to person, place, and time. Mental status is at baseline.  Psychiatric:        Mood and Affect: Mood normal.        Behavior: Behavior normal.      UC Treatments / Results  Labs (all labs ordered are listed, but only abnormal results are displayed) Labs Reviewed - No data to display  EKG   Radiology No results found.  Procedures Procedures (including critical care time)  Medications Ordered in UC Medications - No data to display  Initial Impression / Assessment and Plan / UC Course  I have reviewed the triage vital signs and the nursing notes.  Pertinent labs & imaging results that were available during my care of the patient were reviewed by me and considered in my medical decision making (see chart for details).     Patient presents with symptoms likely from  a viral upper respiratory infection. Differential includes bacterial pneumonia, sinusitis, allergic rhinitis, COVID-19, flu, RSV.  Most likely RSV given  patient's close exposure.  Do not suspect underlying cardiopulmonary process. Symptoms seem unlikely related to ACS, CHF or COPD exacerbations, pneumonia, pneumothorax. Patient is nontoxic appearing and not in need of emergent medical intervention.  Patient declined testing for viruses with shared decision making as it would not change treatment.  Recommended symptom control with medications and supportive care.  Patient sent prescriptions.  Advised patient that Promethazine DM can cause drowsiness.  Patient denies that she takes any daily medications so these meds that should be safe.  Return if symptoms fail to improve in 1-2 weeks or you develop shortness of breath, chest pain, severe headache. Patient states understanding and is agreeable.  Discharged with PCP followup.  Final Clinical Impressions(s) / UC Diagnoses   Final diagnoses:  Viral upper respiratory tract infection with cough  RSV exposure     Discharge Instructions      Highly suspicious that you have RSV given that your daughter has this.  I have prescribed you a few medications to alleviate symptoms as that should run its course and is treated symptomatically.  Promethazine DM is taken at night prior to going to bed as it can cause drowsiness.  Follow-up if symptoms persist or worsen.    ED Prescriptions     Medication Sig Dispense Auth. Provider   fluticasone (FLONASE) 50 MCG/ACT nasal spray Place 1 spray into both nostrils daily. 16 g Dalana Pfahler, Rolly Salter E, [place]   benzonatate (TESSALON) 100 MG capsule Take 1 capsule (100 mg total) by mouth every 8 (eight) hours as needed for cough. 21 capsule [place], [place] E, [place]   promethazine-dextromethorphan (PROMETHAZINE-DM) 6.25-15 MG/5ML syrup Take 5 mLs by mouth at bedtime as needed for cough. 118 mL Gustavus Bryant, [place]      PDMP not reviewed this encounter.   Gustavus Bryant, [place] 09/16/22 1454

## 2022-09-16 NOTE — ED Triage Notes (Addendum)
[facility] to [facility] with co of ha, cough, runny nose, congestion for 4 days. Pt reports otc cough and cold medications. Daughter currently has rsv and ear infection

## 2023-03-17 ENCOUNTER — Ambulatory Visit
Admission: EM | Admit: 2023-03-17 | Discharge: 2023-03-17 | Disposition: A | Discharge status: Home / Self Care | Payer: BC Managed Care – PPO | Attending: Physician Assistant | Admitting: Physician Assistant

## 2023-03-17 DIAGNOSIS — S39012A Strain of muscle, fascia and tendon of lower back, initial encounter: Secondary | ICD-10-CM

## 2023-03-17 MED ORDER — METHOCARBAMOL 500 MG PO TABS: 500.0000 mg | ORAL_TABLET | Freq: Four times a day (QID) | ORAL | 0 refills | Status: DC

## 2023-03-17 MED ORDER — IBUPROFEN 800 MG PO TABS: 800.0000 mg | ORAL_TABLET | Freq: Three times a day (TID) | ORAL | 0 refills | Status: DC | PRN

## 2023-03-17 NOTE — ED Triage Notes (Signed)
Pt present lower back pain, symptoms started today while bending down to lay something on the floor while at work

## 2023-03-17 NOTE — Discharge Instructions (Signed)
Return if any problems.

## 2023-03-17 NOTE — ED Provider Notes (Signed)
[facility]    CSN: [id number] Arrival date & time: 03/17/23  1321      History   Chief Complaint Chief Complaint  Patient presents with   Back Pain    HPI Brenda Simon is a 32 y.o. female.   Pt complains of low back pain after lifting at work.  Pt has had similar in the past and thinks she may have pulled a muscle   The history is provided by the patient. No language interpreter was used.  Back Pain Location:  Generalized Quality:  Aching Pain severity:  Moderate Pain is:  Worse during the day Onset quality:  Gradual Duration:  2 days Timing:  Constant Progression:  Worsening Chronicity:  New Relieved by:  Nothing Worsened by:  Nothing Ineffective treatments:  None tried   Past Medical History:  Diagnosis Date   Chlamydia    Sickle cell trait [facility])     Patient Active Problem List   Diagnosis Date Noted   Oligohydramnios 12/05/2018   Depression 12/10/2012    Past Surgical History:  Procedure Laterality Date   CESAREAN SECTION N/A 12/06/2018   Procedure: CESAREAN SECTION;  Surgeon: Levie Heritage, DO;  Location: [facility] LD [facility]S;  Service: Obstetrics;  Laterality: N/A;   NO PAST SURGERIES      OB History     Gravida  1   Para  1   Term  1   Preterm      AB      Living  1      SAB      IAB      Ectopic      Multiple  0   Live Births  1            Home Medications    Prior to Admission medications   Medication Sig Start Date End Date Taking? Authorizing Provider  ibuprofen (ADVIL) 800 MG tablet Take 1 tablet (800 mg total) by mouth every 8 (eight) hours as needed. 03/17/23  Yes Cheron Schaumann K, PA-C  methocarbamol (ROBAXIN) 500 MG tablet Take 1 tablet (500 mg total) by mouth 4 (four) times daily. 03/17/23  Yes Cheron Schaumann K, PA-C  benzonatate (TESSALON) 100 MG capsule Take 1 capsule (100 mg total) by mouth every 8 (eight) hours as needed for cough. 09/16/22   Gustavus Bryant, FNP  fluconazole (DIFLUCAN) 150 MG tablet Take  one tablet. Take another if no improvement in 3 days. Patient not taking: Reported on 12/30/2021 01/20/21   Vallery Sa, Amy L, PA  fluticasone (FLONASE) 50 MCG/ACT nasal spray Place 1 spray into both nostrils daily. 09/16/22   Gustavus Bryant, FNP  oseltamivir (TAMIFLU) 75 MG capsule Take 1 capsule (75 mg total) by mouth every 12 (twelve) hours. Patient not taking: Reported on 12/30/2021 08/05/21   Tomi Bamberger, PA-C  promethazine-dextromethorphan (PROMETHAZINE-DM) 6.25-15 MG/5ML syrup Take 5 mLs by mouth at bedtime as needed for cough. 09/16/22   Gustavus Bryant, FNP    Family History Family History  Problem Relation Age of Onset   Hypertension Mother     Social History Social History   Tobacco Use   Smoking status: Former    Packs/day: 1.5    Types: Cigars, Cigarettes   Smokeless tobacco: Never   Tobacco comments:    stopped after pos. UPT  Vaping Use   Vaping Use: Never used  Substance Use Topics   Alcohol use: No   Drug use: Not Currently  Types: Marijuana     Allergies   Patient has no known allergies.   Review of Systems Review of Systems  Musculoskeletal:  Positive for back pain.  All other systems reviewed and are negative.    Physical Exam Triage Vital Signs ED Triage Vitals  Enc Vitals Group     BP 03/17/23 1437 111/75     Pulse Rate 03/17/23 1437 86     Resp 03/17/23 1437 18     Temp 03/17/23 1437 98.2 F (36.8 C)     Temp Source 03/17/23 1437 Oral     SpO2 03/17/23 1437 100 %     Weight --      Height --      Head Circumference --      Peak Flow --      Pain Score 03/17/23 1436 10     Pain Loc --      Pain Edu? --      Excl. in [place]? --    No data found.  Updated Vital Signs BP 111/75 (BP Location: Left Arm)   Pulse 86   Temp 98.2 F (36.8 C) (Oral)   Resp 18   SpO2 100%   Visual Acuity Right Eye Distance:   Left Eye Distance:   Bilateral Distance:    Right Eye Near:   Left Eye Near:    Bilateral Near:     Physical Exam Vitals  reviewed.  Constitutional:      Appearance: Normal appearance.  Cardiovascular:     Rate and Rhythm: Normal rate.  Pulmonary:     Effort: Pulmonary effort is normal.  Abdominal:     General: Abdomen is flat.  Musculoskeletal:        General: Normal range of motion.  Skin:    General: Skin is warm.  Neurological:     General: No focal deficit present.     Mental Status: She is alert.  Psychiatric:        Mood and Affect: Mood normal.      UC Treatments / Results  Labs (all labs ordered are listed, but only abnormal results are displayed) Labs Reviewed - No data to display  EKG   Radiology No results found.  Procedures Procedures (including critical care time)  Medications Ordered in UC Medications - No data to display  Initial Impression / Assessment and Plan / UC Course  I have reviewed the triage vital signs and the nursing notes.  Pertinent labs & imaging results that were available during my care of the patient were reviewed by me and considered in my medical decision making (see chart for details).      Final Clinical Impressions(s) / UC Diagnoses   Final diagnoses:  Strain of lumbar region, initial encounter     Discharge Instructions      Return if any problems.     ED Prescriptions     Medication Sig Dispense Auth. Provider   ibuprofen (ADVIL) 800 MG tablet Take 1 tablet (800 mg total) by mouth every 8 (eight) hours as needed. 30 tablet Una Yeomans K, [place]   methocarbamol (ROBAXIN) 500 MG tablet Take 1 tablet (500 mg total) by mouth 4 (four) times daily. 20 tablet Elson Areas, [place]      PDMP not reviewed this encounter. An After Visit Summary was printed and given to the patient.    Elson Areas, [place] 03/17/23 1512

## 2023-04-26 ENCOUNTER — Ambulatory Visit
Admission: EM | Admit: 2023-04-26 | Discharge: 2023-04-26 | Disposition: A | Discharge status: Home / Self Care | Payer: BC Managed Care – PPO | Attending: Emergency Medicine | Admitting: Emergency Medicine

## 2023-04-26 ENCOUNTER — Encounter: Payer: Self-pay | Admitting: *Deleted

## 2023-04-26 DIAGNOSIS — Z1152 Encounter for screening for COVID-19: Secondary | ICD-10-CM | POA: Diagnosis not present

## 2023-04-26 DIAGNOSIS — M545 Low back pain, unspecified: Secondary | ICD-10-CM

## 2023-04-26 DIAGNOSIS — R112 Nausea with vomiting, unspecified: Secondary | ICD-10-CM | POA: Diagnosis present

## 2023-04-26 MED ORDER — ONDANSETRON 4 MG PO TBDP
4.0000 mg | ORAL_TABLET | Freq: Three times a day (TID) | ORAL | 0 refills | Status: DC | PRN
Start: 2023-04-26 — End: 2023-08-04

## 2023-04-26 MED ORDER — PREDNISONE 20 MG PO TABS: 40.0000 mg | ORAL_TABLET | Freq: Every day | ORAL | 0 refills | Status: DC

## 2023-04-26 MED ORDER — KETOROLAC TROMETHAMINE 30 MG/ML IJ SOLN
30.0000 mg | Freq: Once | INTRAMUSCULAR | Status: AC
Start: 2023-04-26 — End: 2023-04-26
  Administered 2023-04-26: 30 mg via INTRAMUSCULAR

## 2023-04-26 NOTE — Discharge Instructions (Signed)
Your pain is most likely caused by irritation to the muscles .  You have been given an injection of Toradol today here in the office to help reduce inflammation and help with pain, daily will see improvement in about 30 minutes to an hour  Starting tomorrow take prednisone every morning to continue the above process, may use Tylenol and muscle relaxer in addition to this  You may use heating pad in 15 minute intervals as needed for additional comfort, within the first 2-3 days you may find comfort in using ice in 10-15 minutes over affected area  Begin stretching affected area daily for 10 minutes as tolerated to further loosen muscles   When lying down place pillow underneath and between knees for support  Can try sleeping without pillow on firm mattress   Practice good posture: head back, shoulders back, chest forward, pelvis back and weight distributed evenly on both legs  If pain persist after recommended treatment or reoccurs if may be beneficial to follow up with orthopedic specialist for evaluation, this doctor specializes in the bones and can manage your symptoms long-term with options such as but not limited to imaging, medications or physical therapy    Your symptoms today are most likely being caused by a virus and should steadily improve in time it can take up to 7 to 10 days before you truly start to see a turnaround however things will get better  COVID test is pending up to 24 hours, if positive you will need to quarantine until you are without fever for 24 hours, if no fever you may continue activity wearing mask until symptoms resolve  You may use Zofran every 8 hours as needed for nausea and vomiting    You can take Tylenol and/or Ibuprofen as needed for fever reduction and pain relief.   For cough: honey 1/2 to 1 teaspoon (you can dilute the honey in water or another fluid).  You can also use guaifenesin and dextromethorphan for cough. You can use a humidifier for chest  congestion and cough.  If you don't have a humidifier, you can sit in the bathroom with the hot shower running.      For sore throat: try warm salt water gargles, cepacol lozenges, throat spray, warm tea or water with lemon/honey, popsicles or ice, or OTC cold relief medicine for throat discomfort.   For congestion: take a daily anti-histamine like Zyrtec, Claritin, and a oral decongestant, such as pseudoephedrine.  You can also use Flonase 1-2 sprays in each nostril daily.   It is important to stay hydrated: drink plenty of fluids (water, gatorade/powerade/pedialyte, juices, or teas) to keep your throat moisturized and help further relieve irritation/discomfort.

## 2023-04-26 NOTE — ED Provider Notes (Signed)
[facility]    CSN: [id number] Arrival date & time: 04/26/23  1403      History   Chief Complaint Chief Complaint  Patient presents with   Back Pain    HPI Brenda Simon is a 32 y.o. female.   Patient presents for evaluation of intermittent bilateral low back pain that has been present for 1 month.  Radiating to the buttocks.  Exacerbated by long periods of sitting and changing from a sitting to a standing position.  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  Was evaluated about 1 month ago in urgent care, has been using prescribed NSAIDs which have been somewhat helpful but do not resolve symptoms, muscle relaxants ineffective.  Denies numbness, tingling, urinary or bowel incontinence.    Patient concerned with vomiting and chills that began this morning upon awakening.  Has vomited approximately 3 times.  Unable to tolerate food or liquids.  No known sick contacts but did attend a large gathering over the weekend.  Has not attempted treatment of symptoms.  Denies abdominal pain, diarrhea, fevers or URI symptoms.  Requesting COVID testing.   Past Medical History:  Diagnosis Date   Chlamydia    Sickle cell trait [facility])     Patient Active Problem List   Diagnosis Date Noted   Oligohydramnios 12/05/2018   Depression 12/10/2012    Past Surgical History:  Procedure Laterality Date   CESAREAN SECTION N/A 12/06/2018   Procedure: CESAREAN SECTION;  Surgeon: Levie Heritage, DO;  Location: [facility] LD [facility]S;  Service: Obstetrics;  Laterality: N/A;    OB History     Gravida  1   Para  1   Term  1   Preterm      AB      Living  1      SAB      IAB      Ectopic      Multiple  0   Live Births  1            Home Medications    Prior to Admission medications   Medication Sig Start Date End Date Taking? Authorizing Provider  ibuprofen (ADVIL) 800 MG tablet Take 1 tablet (800 mg total) by mouth every 8 (eight) hours  as needed. 03/17/23  Yes Cheron Schaumann K, PA-C  benzonatate (TESSALON) 100 MG capsule Take 1 capsule (100 mg total) by mouth every 8 (eight) hours as needed for cough. 09/16/22   Gustavus Bryant, FNP  fluconazole (DIFLUCAN) 150 MG tablet Take one tablet. Take another if no improvement in 3 days. Patient not taking: Reported on 12/30/2021 01/20/21   Vallery Sa, Amy L, PA  fluticasone (FLONASE) 50 MCG/ACT nasal spray Place 1 spray into both nostrils daily. 09/16/22   Gustavus Bryant, FNP  methocarbamol (ROBAXIN) 500 MG tablet Take 1 tablet (500 mg total) by mouth 4 (four) times daily. 03/17/23   Elson Areas, PA-C  oseltamivir (TAMIFLU) 75 MG capsule Take 1 capsule (75 mg total) by mouth every 12 (twelve) hours. Patient not taking: Reported on 12/30/2021 08/05/21   Tomi Bamberger, PA-C  promethazine-dextromethorphan (PROMETHAZINE-DM) 6.25-15 MG/5ML syrup Take 5 mLs by mouth at bedtime as needed for cough. 09/16/22   Gustavus Bryant, FNP    Family History Family History  Problem Relation Age of Onset   Hypertension Mother     Social History Social History   Tobacco Use   Smoking status: Former  Current packs/day: 1.50    Types: Cigars, Cigarettes   Smokeless tobacco: Never   Tobacco comments:    stopped after pos. UPT  Vaping Use   Vaping status: Never Used  Substance Use Topics   Alcohol use: Yes    Comment: occasionally   Drug use: Not Currently    Types: Marijuana     Allergies   Patient has no known allergies.   Review of Systems Review of Systems   Physical Exam Triage Vital Signs ED Triage Vitals  Encounter Vitals Group     BP 04/26/23 1422 138/83     Systolic BP Percentile --      Diastolic BP Percentile --      Pulse Rate 04/26/23 1422 95     Resp 04/26/23 1422 16     Temp 04/26/23 1422 98.5 F (36.9 C)     Temp Source 04/26/23 1422 Oral     SpO2 04/26/23 1422 99 %     Weight --      Height --      Head Circumference --      Peak Flow --      Pain Score  04/26/23 1423 8     Pain Loc --      Pain Education --      Exclude from Growth Chart --    No data found.  Updated Vital Signs BP 138/83   Pulse 95   Temp 98.5 F (36.9 C) (Oral)   Resp 16   SpO2 99%   Visual Acuity Right Eye Distance:   Left Eye Distance:   Bilateral Distance:    Right Eye Near:   Left Eye Near:    Bilateral Near:     Physical Exam Constitutional:      Appearance: Normal appearance.  Eyes:     Extraocular Movements: Extraocular movements intact.  Pulmonary:     Effort: Pulmonary effort is normal.  Abdominal:     General: Abdomen is flat. Bowel sounds are normal. There is no distension.     Palpations: Abdomen is soft.     Tenderness: There is no abdominal tenderness. There is no right CVA tenderness, left CVA tenderness or guarding.  Musculoskeletal:     Comments: Tenderness is generalized to the lumbar region without ecchymosis swelling or deformity, positive straight leg test bilaterally, difficulty sitting erect during exam  Neurological:     Mental Status: She is alert and oriented to person, place, and time. Mental status is at baseline.      UC Treatments / Results  Labs (all labs ordered are listed, but only abnormal results are displayed) Labs Reviewed  SARS CORONAVIRUS 2 (TAT 6-24 HRS)    EKG   Radiology No results found.  Procedures Procedures (including critical care time)  Medications Ordered in UC Medications - No data to display  Initial Impression / Assessment and Plan / UC Course  I have reviewed the triage vital signs and the nursing notes.  Pertinent labs & imaging results that were available during my care of the patient were reviewed by me and considered in my medical decision making (see chart for details).  Acute bilateral low back pain without sciatica, nausea and vomiting  Etiology most likely muscular, no spinal tenderness on exam, imaging unavailable in clinic today, Toradol injection given in office and  prescribed prednisone for outpatient use, recommended RICE heat massage stretching and activity as tolerated, walking referral given to orthopedics if symptoms continue to persist or worsen  Etiology of vomiting most likely viral, discussed this with patient, COVID test pending and discussed quarantine guidelines, if positive as a young healthy adult does not qualify for antivirals, Zofran sent to pharmacy and advised increase fluid intake until symptoms resolve with food as tolerated, may follow-up with urgent care as needed Final Clinical Impressions(s) / UC Diagnoses   Final diagnoses:  Nausea and vomiting, unspecified vomiting type   Discharge Instructions   None    ED Prescriptions   None    PDMP not reviewed this encounter.   Valinda Hoar, NP 04/26/23 1556

## 2023-04-26 NOTE — ED Triage Notes (Addendum)
C/O hx of "pulled muscle in low back" approx 1 month ago; since then has been having flare-ups. 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 Describes pain across low back without radiation, sharp pains when attempting to bed at the waist. Denies parasthesias.  C/O vomiting and chills onset this AM. States able to keep down IBU and small sips PO fluids. C/O slight cough, but denies nasal congestion. States she was recently at a cookout, so she wishes to be tested for Covid.

## 2023-04-27 LAB — SARS CORONAVIRUS 2 (TAT 6-24 HRS): SARS Coronavirus 2: NEGATIVE

## 2023-07-10 IMAGING — DX DG FOOT COMPLETE 3+V*L*
3 series · 3 of 3 positions shown · non-contrast
Comparison: None.

CLINICAL DATA: Two day history of left heel pain started while
walking at work.

EXAM:
LEFT FOOT - COMPLETE 3+ VIEW

[foot supine dp]
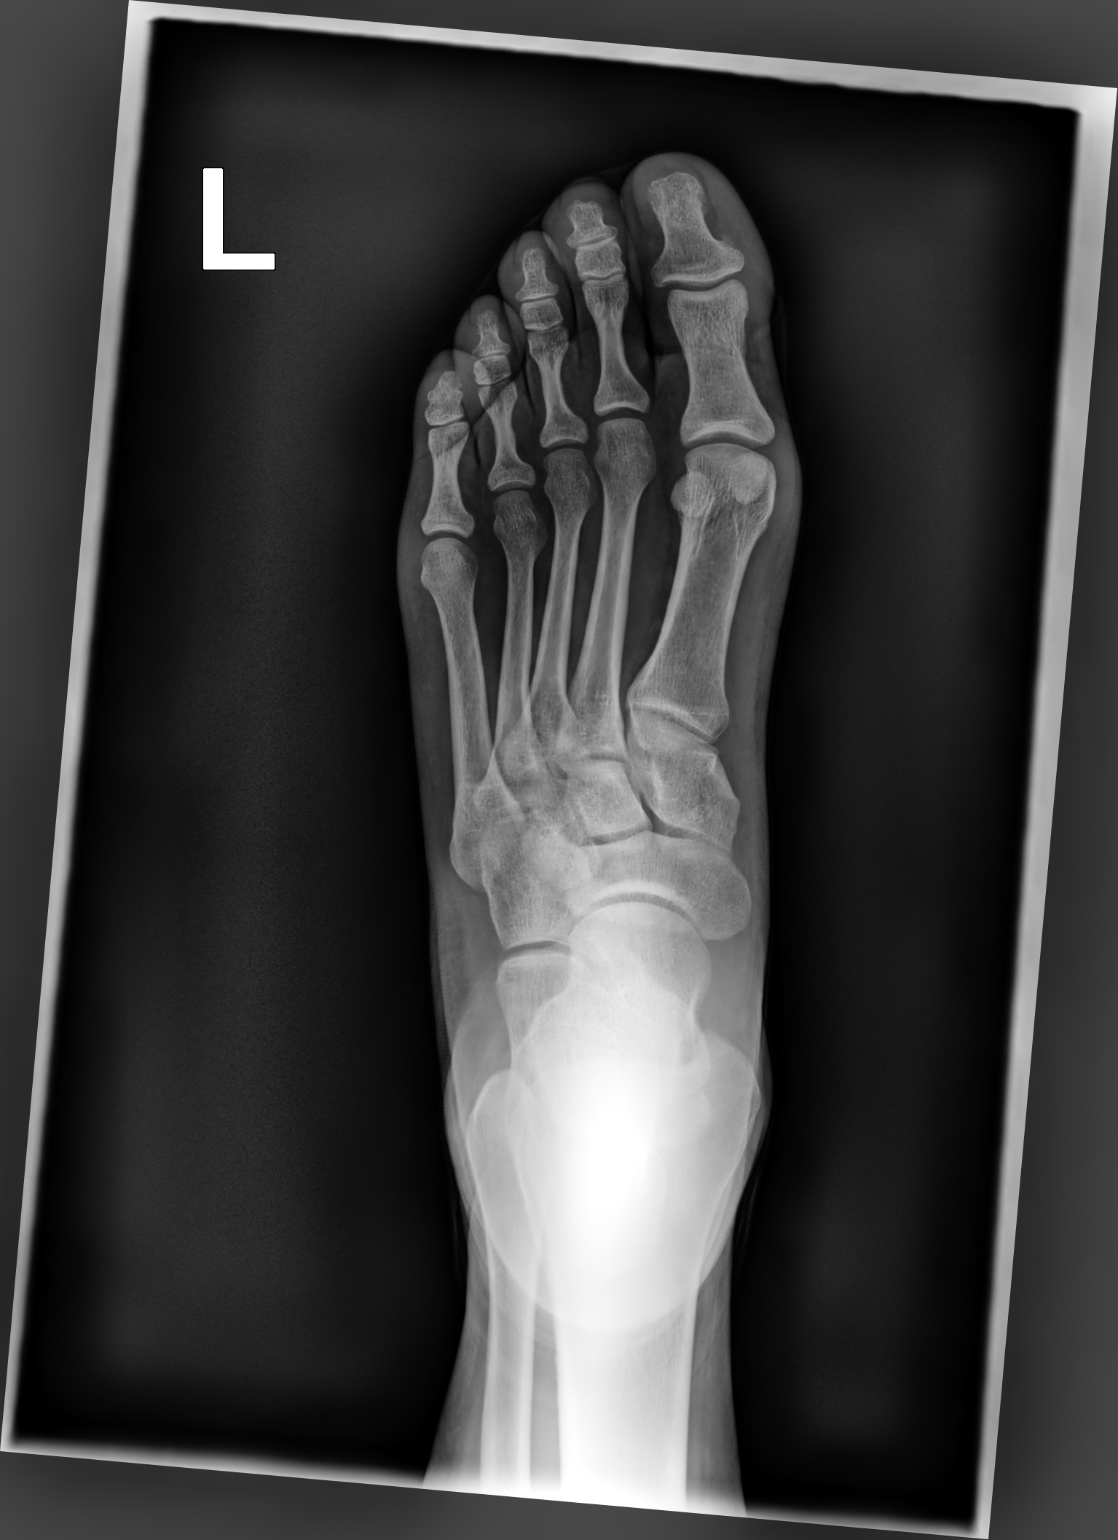

[foot medial oblique]
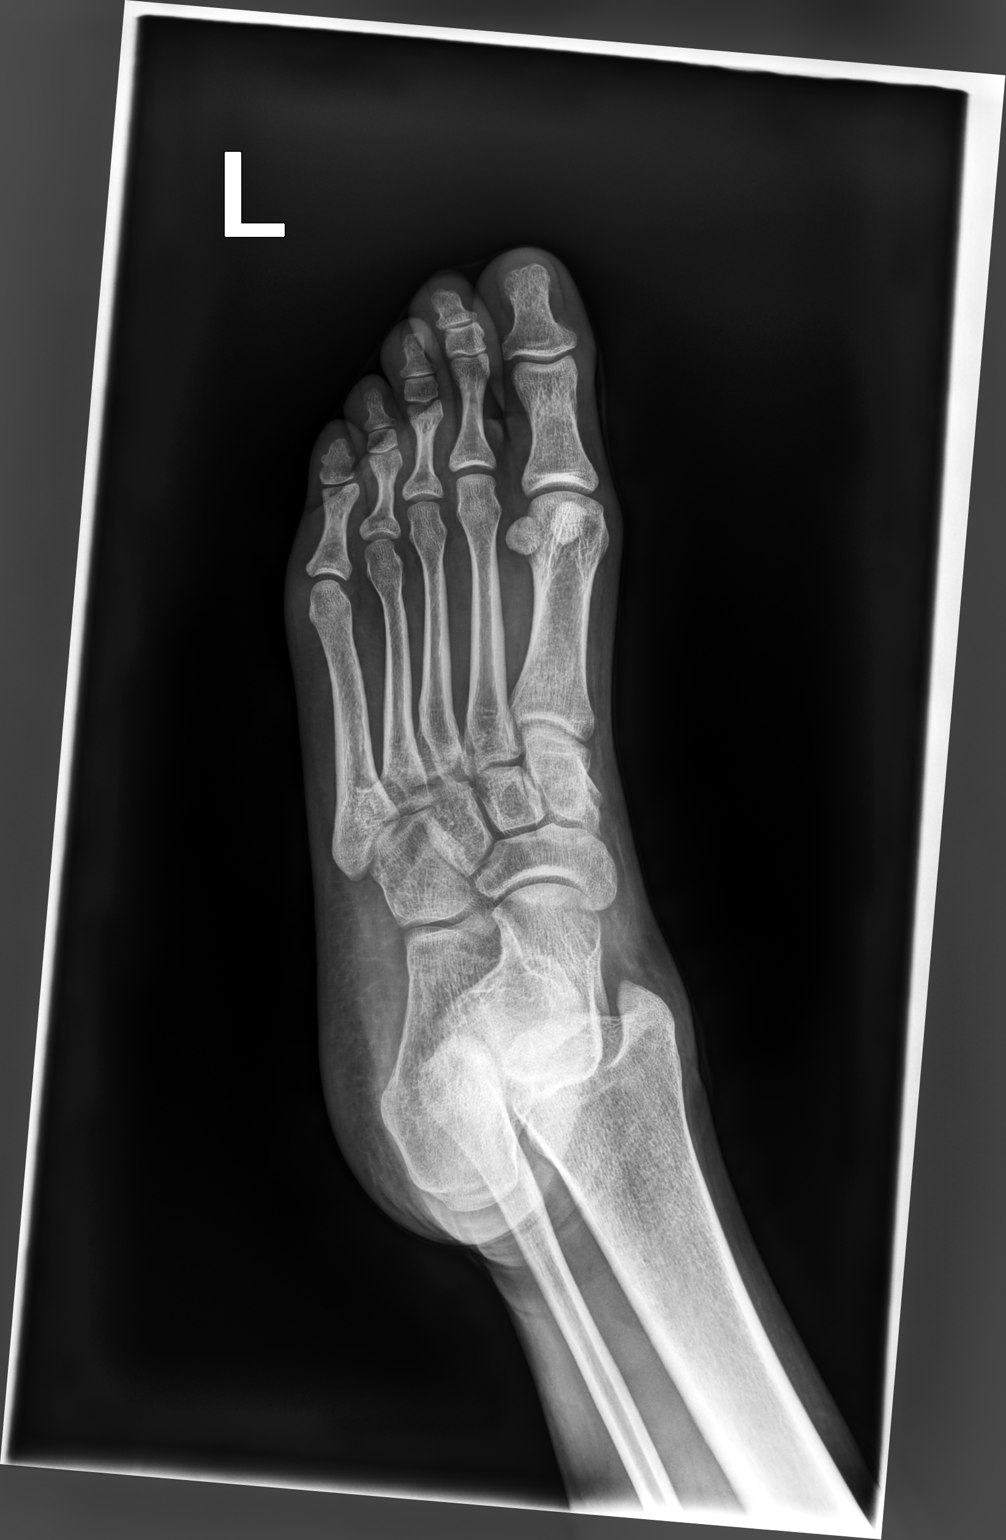

[foot supine lat]
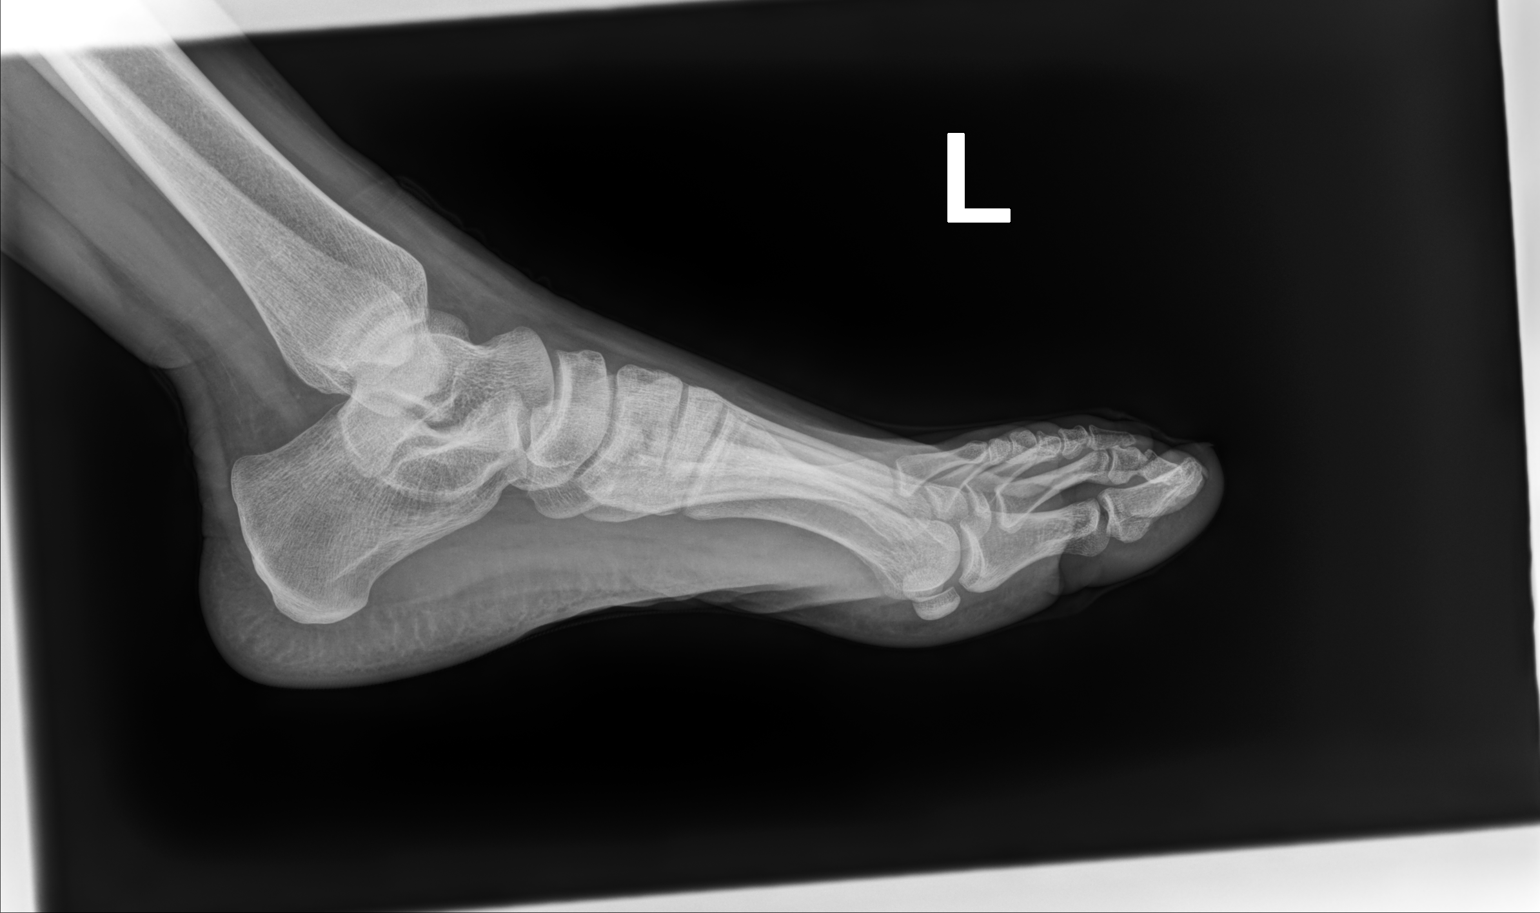

[3 of 3 positions shown; findings below may reference images not displayed]

FINDINGS: No fracture or bone lesion.

Joints are normally spaced and aligned.  No arthropathic changes.

Normal soft tissues.
IMPRESSION: Negative.

## 2023-08-04 ENCOUNTER — Ambulatory Visit
Admission: EM | Admit: 2023-08-04 | Discharge: 2023-08-04 | Disposition: A | Discharge status: Home / Self Care | Payer: BC Managed Care – PPO | Attending: Internal Medicine | Admitting: Internal Medicine

## 2023-08-04 DIAGNOSIS — Z3202 Encounter for pregnancy test, result negative: Secondary | ICD-10-CM | POA: Diagnosis present

## 2023-08-04 DIAGNOSIS — J069 Acute upper respiratory infection, unspecified: Secondary | ICD-10-CM

## 2023-08-04 DIAGNOSIS — Z20822 Contact with and (suspected) exposure to covid-19: Secondary | ICD-10-CM

## 2023-08-04 DIAGNOSIS — J029 Acute pharyngitis, unspecified: Secondary | ICD-10-CM | POA: Diagnosis present

## 2023-08-04 LAB — POCT RAPID STREP A (OFFICE): Rapid Strep A Screen: NEGATIVE

## 2023-08-04 LAB — POCT URINE PREGNANCY: Preg Test, Ur: NEGATIVE

## 2023-08-04 MED ORDER — BENZONATATE 100 MG PO CAPS: 100.0000 mg | ORAL_CAPSULE | Freq: Three times a day (TID) | ORAL | 0 refills | Status: DC | PRN

## 2023-08-04 MED ORDER — ONDANSETRON 4 MG PO TBDP: 4.0000 mg | ORAL_TABLET | Freq: Three times a day (TID) | ORAL | 0 refills | Status: DC | PRN

## 2023-08-04 MED ORDER — DEXAMETHASONE SODIUM PHOSPHATE 10 MG/ML IJ SOLN
10.0000 mg | Freq: Once | INTRAMUSCULAR | Status: AC
  Administered 2023-08-04: 10 mg via INTRAMUSCULAR

## 2023-08-04 NOTE — ED Provider Notes (Addendum)
[facility]    CSN: [id number] Arrival date & time: 08/04/23  0630      History   Chief Complaint Chief Complaint  Patient presents with   Cough    HPI Brenda Simon is a 32 y.o. female.   Patient presents with chills, sore throat, generalized bodyaches, nausea, vomiting, cough, fever, runny nose.  Cough started about a week ago but other symptoms started about 3 days ago.  Rapid strep is Tmax at home was 101.  Denies blood in emesis.  Reports COVID and flu exposure at work.  She has taken NyQuil and ibuprofen for symptoms.  Denies history of asthma.  Denies chest pain or shortness of breath.   Cough   Past Medical History:  Diagnosis Date   Chlamydia    Sickle cell trait [facility])     Patient Active Problem List   Diagnosis Date Noted   Oligohydramnios 12/05/2018   Depression 12/10/2012    Past Surgical History:  Procedure Laterality Date   CESAREAN SECTION N/A 12/06/2018   Procedure: CESAREAN SECTION;  Surgeon: Levie Heritage, DO;  Location: [facility] LD [facility]S;  Service: Obstetrics;  Laterality: N/A;    OB History     Gravida  1   Para  1   Term  1   Preterm      AB      Living  1      SAB      IAB      Ectopic      Multiple  0   Live Births  1            Home Medications    Prior to Admission medications   Medication Sig Start Date End Date Taking? Authorizing Provider  benzonatate (TESSALON) 100 MG capsule Take 1 capsule (100 mg total) by mouth every 8 (eight) hours as needed for cough. 08/04/23  Yes Mauricio Dahlen, Rolly Salter E, FNP  ondansetron (ZOFRAN-ODT) 4 MG disintegrating tablet Take 1 tablet (4 mg total) by mouth every 8 (eight) hours as needed for nausea or vomiting. 08/04/23  Yes Anneli Bing, Rolly Salter E, FNP  fluconazole (DIFLUCAN) 150 MG tablet Take one tablet. Take another if no improvement in 3 days. Patient not taking: Reported on 12/30/2021 01/20/21   Vallery Sa, Amy L, PA  fluticasone (FLONASE) 50 MCG/ACT nasal spray Place 1 spray into  both nostrils daily. 09/16/22   Gustavus Bryant, FNP  ibuprofen (ADVIL) 800 MG tablet Take 1 tablet (800 mg total) by mouth every 8 (eight) hours as needed. 03/17/23   Elson Areas, PA-C  methocarbamol (ROBAXIN) 500 MG tablet Take 1 tablet (500 mg total) by mouth 4 (four) times daily. 03/17/23   Elson Areas, PA-C  oseltamivir (TAMIFLU) 75 MG capsule Take 1 capsule (75 mg total) by mouth every 12 (twelve) hours. Patient not taking: Reported on 12/30/2021 08/05/21   Tomi Bamberger, PA-C  predniSONE (DELTASONE) 20 MG tablet Take 2 tablets (40 mg total) by mouth daily. 04/26/23   Valinda Hoar, NP    Family History Family History  Problem Relation Age of Onset   Hypertension Mother     Social History Social History   Tobacco Use   Smoking status: Former    Current packs/day: 1.50    Types: Cigars, Cigarettes   Smokeless tobacco: Never   Tobacco comments:    stopped after pos. UPT  Vaping Use   Vaping status: Some Days  Substance Use Topics   Alcohol use:  Yes    Comment: occasionally   Drug use: Not Currently    Types: Marijuana     Allergies   Patient has no known allergies.   Review of Systems Review of Systems Per HPI  Physical Exam Triage Vital Signs ED Triage Vitals  Encounter Vitals Group     BP 08/04/23 1209 132/87     Systolic BP Percentile --      Diastolic BP Percentile --      Pulse Rate 08/04/23 1209 77     Resp 08/04/23 1209 18     Temp 08/04/23 1209 98.2 F (36.8 C)     Temp Source 08/04/23 1209 Oral     SpO2 08/04/23 1209 100 %     Weight 08/04/23 1207 163 lb (73.9 kg)     Height 08/04/23 1207 5\' 4"  (1.626 m)     Head Circumference --      Peak Flow --      Pain Score 08/04/23 1207 8     Pain Loc --      Pain Education --      Exclude from Growth Chart --    No data found.  Updated Vital Signs BP 132/87 (BP Location: Left Arm)   Pulse 77   Temp 98.2 F (36.8 C) (Oral)   Resp 18   Ht 5\' 4"  (1.626 m)   Wt 163 lb (73.9 kg)   LMP  07/01/2023   SpO2 100%   BMI 27.98 kg/m   Visual Acuity Right Eye Distance:   Left Eye Distance:   Bilateral Distance:    Right Eye Near:   Left Eye Near:    Bilateral Near:     Physical Exam Constitutional:      General: She is not in acute distress.    Appearance: Normal appearance. She is not toxic-appearing or diaphoretic.  HENT:     Head: Normocephalic and atraumatic.     Right Ear: Ear canal normal. No drainage, swelling or tenderness. A middle ear effusion is present. Tympanic membrane is not perforated, erythematous or bulging.     Left Ear: Ear canal normal. No drainage, swelling or tenderness. A middle ear effusion is present. Tympanic membrane is not perforated, erythematous or bulging.     Nose: Congestion present.     Mouth/Throat:     Mouth: Mucous membranes are moist.     Pharynx: Posterior oropharyngeal erythema present.  Eyes:     Extraocular Movements: Extraocular movements intact.     Conjunctiva/sclera: Conjunctivae normal.     Pupils: Pupils are equal, round, and reactive to light.  Cardiovascular:     Rate and Rhythm: Normal rate and regular rhythm.     Pulses: Normal pulses.     Heart sounds: Normal heart sounds.  Pulmonary:     Effort: Pulmonary effort is normal. No respiratory distress.     Breath sounds: Normal breath sounds. No stridor. No wheezing, rhonchi or rales.  Abdominal:     General: Abdomen is flat. Bowel sounds are normal.     Palpations: Abdomen is soft.  Musculoskeletal:        General: Normal range of motion.     Cervical back: Normal range of motion.  Skin:    General: Skin is warm and dry.  Neurological:     General: No focal deficit present.     Mental Status: She is alert and oriented to person, place, and time. Mental status is at baseline.  Psychiatric:  Mood and Affect: Mood normal.        Behavior: Behavior normal.      UC Treatments / Results  Labs (all labs ordered are listed, but only abnormal results  are displayed) Labs Reviewed  SARS CORONAVIRUS 2 (TAT 6-24 HRS)  POCT URINE PREGNANCY  POCT RAPID STREP A (OFFICE)    EKG   Radiology No results found.  Procedures Procedures (including critical care time)  Medications Ordered in UC Medications  dexamethasone (DECADRON) injection 10 mg (10 mg Intramuscular Given 08/04/23 1255)    Initial Impression / Assessment and Plan / UC Course  I have reviewed the triage vital signs and the nursing notes.  Pertinent labs & imaging results that were available during my care of the patient were reviewed by me and considered in my medical decision making (see chart for details).     Patient presents with symptoms likely from a viral upper respiratory infection. Do not suspect underlying cardiopulmonary process. Symptoms seem unlikely related to ACS, CHF or COPD exacerbations, pneumonia, pneumothorax. Patient is nontoxic appearing and not in need of emergent medical intervention.  Rapid strep negative. Flu testing deferred given duration of symptoms as it would not change treatment.  COVID testing pending.  Recommended symptom control with medications and supportive care. Decadron administered in urgent care.  Benzonatate and ondansetron prescribed today to take as needed for symptoms.  Advised adequate fluid hydration and rest.  No signs of dehydration on exam.  Preg test completed to ensure safe administration of meds. It was negative.   Return if symptoms fail to improve. Patient states understanding and is agreeable.  Discharged with PCP followup.  Final Clinical Impressions(s) / UC Diagnoses   Final diagnoses:  Close exposure to COVID-19 virus  Viral upper respiratory tract infection with cough  Sore throat  Urine pregnancy test negative     Discharge Instructions      It appears that you have a viral illness that should run its course as we discussed.  You were given a steroid injection today in urgent care to help alleviate  excessive coughing and decrease inflammation associated with your symptoms.  I have also prescribed you nausea medication and cough medication to take as needed.  Please ensure you are drinking plenty of water and getting plenty of rest.  Rapid strep is negative.  Throat culture and COVID test pending.  Will call if they are positive.  Your pregnancy test was also negative.    ED Prescriptions     Medication Sig Dispense Auth. Provider   ondansetron (ZOFRAN-ODT) 4 MG disintegrating tablet Take 1 tablet (4 mg total) by mouth every 8 (eight) hours as needed for nausea or vomiting. 20 tablet Beardsley, [place] E, [place]   benzonatate (TESSALON) 100 MG capsule Take 1 capsule (100 mg total) by mouth every 8 (eight) hours as needed for cough. 21 capsule [place], Acie Fredrickson, [place]      PDMP not reviewed this encounter.   Gustavus Bryant, [place] 08/04/23 1304    Gustavus Bryant, [place] 08/04/23 [phone]

## 2023-08-04 NOTE — Discharge Instructions (Signed)
It appears that you have a viral illness that should run its course as we discussed.  You were given a steroid injection today in urgent care to help alleviate excessive coughing and decrease inflammation associated with your symptoms.  I have also prescribed you nausea medication and cough medication to take as needed.  Please ensure you are drinking plenty of water and getting plenty of rest.  Rapid strep is negative.  Throat culture and COVID test pending.  Will call if they are positive.  Your pregnancy test was also negative.

## 2023-08-04 NOTE — ED Triage Notes (Signed)
Patient presents chills, sore throat, body aches, vomiting, headache, cough, fever of 101, this morning, Coughing started last week, additional symptoms started Monday. Treated with Nyquil and Ibuprofen.

## 2023-08-05 LAB — SARS CORONAVIRUS 2 (TAT 6-24 HRS): SARS Coronavirus 2: NEGATIVE

## 2023-08-07 ENCOUNTER — Telehealth: Payer: Self-pay

## 2023-08-07 LAB — CULTURE, GROUP A STREP (THRC)

## 2023-08-07 MED ORDER — AMOXICILLIN 500 MG PO CAPS: 500.0000 mg | ORAL_CAPSULE | Freq: Two times a day (BID) | ORAL | 0 refills | Status: AC

## 2023-08-07 NOTE — Telephone Encounter (Signed)
Per Helaine Chess,  PA-C, treat with Amoxicillin 500mg  BID x10 days, if pt is still symptomatic.  TC to pt, who reports continued symptoms.  Reviewed with patient, verified pharmacy, prescription sent.

## 2023-09-30 ENCOUNTER — Ambulatory Visit
Admission: EM | Admit: 2023-09-30 | Discharge: 2023-09-30 | Disposition: A | Discharge status: Home / Self Care | Payer: BC Managed Care – PPO | Attending: Family Medicine | Admitting: Family Medicine

## 2023-09-30 DIAGNOSIS — M545 Low back pain, unspecified: Secondary | ICD-10-CM

## 2023-09-30 MED ORDER — IBUPROFEN 800 MG PO TABS: 800.0000 mg | ORAL_TABLET | Freq: Three times a day (TID) | ORAL | 0 refills | Status: AC

## 2023-09-30 MED ORDER — CYCLOBENZAPRINE HCL 10 MG PO TABS: ORAL_TABLET | ORAL | 0 refills | Status: DC

## 2023-09-30 NOTE — ED Provider Notes (Signed)
[facility]   [id number] 09/30/23 Arrival Time: 1805  ASSESSMENT & PLAN:  1. Acute bilateral low back pain without sciatica    Able to ambulate here and hemodynamically stable. No indication for imaging of back at this time given no trauma and normal neurological exam. Discussed.  Meds ordered this encounter  Medications   ibuprofen (ADVIL) 800 MG tablet    Sig: Take 1 tablet (800 mg total) by mouth 3 (three) times daily with meals.    Dispense:  21 tablet    Refill:  0   cyclobenzaprine (FLEXERIL) 10 MG tablet    Sig: Take 1 tablet by mouth 3 times daily as needed for muscle spasm. Warning: May cause drowsiness.    Dispense:  21 tablet    Refill:  0   Work/school excuse note: provided. Medication sedation precautions given. Encourage ROM/movement as tolerated.  Recommend:  Follow-up Information     [facility]).   Specialty: Urgent Care Why: If worsening or failing to improve as anticipated. Contact information: [address] [place] [address] [phone]                Reviewed expectations re: course of current medical issues. Questions answered. Outlined signs and symptoms indicating need for more acute intervention. Patient verbalized understanding. After Visit Summary given.   SUBJECTIVE: History from: patient.  Brenda Simon is a 32 y.o. female who presents with complaint of low back pain, states when she lift at work, having pain under arm area, pain radiates in right chest and right shoulder. Pain started yesterday. Patient states she believe she pulled a muscle. Treated with Ibuprofen, helped a little. Patient states she need a work note for missing today. Denies fever/abd pain. H/O similar. Muscle relaxer helped.  OBJECTIVE:  Vitals:   09/30/23 1835 09/30/23 1838  BP:  122/80  Pulse:  77  Resp:  16  Temp:  98.9 F (37.2 C)  TempSrc:  Oral  SpO2:  98%   Weight: 76.2 kg   Height: 5\' 4"  (1.626 m)     General appearance: alert; no distress HEENT: [place]; AT Neck: supple with FROM; without midline tenderness CV: regular Lungs: unlabored respirations; speaks full sentences without difficulty Abdomen: soft, non-tender; non-distended Back: mild  and poorly localized tenderness to palpation over bilateral lumbar musculature ; FROM at waist; bruising: none; without midline tenderness Extremities: without edema; symmetrical without gross deformities; normal ROM of bilateral LE Skin: warm and dry Neurologic: normal gait; normal sensation and strength of bilateral LE Psychological: alert and cooperative; normal mood and affect    No Known Allergies  Past Medical History:  Diagnosis Date   Chlamydia    Sickle cell trait (HCC)    Social History   Socioeconomic History   Marital status: Single    Spouse name: Not on file   Number of children: Not on file   Years of education: Not on file   Highest education level: Not on file  Occupational History   Not on file  Tobacco Use   Smoking status: Every Day    Types: Cigars   Smokeless tobacco: Never   Tobacco comments:    stopped after pos. UPT  Vaping Use   Vaping status: Some Days  Substance and Sexual Activity   Alcohol use: Yes    Comment: occasionally   Drug use: Not Currently    Types: Marijuana   Sexual activity: Not Currently  Comment: None since 08/2022  Other Topics Concern   Not on file  Social History Narrative   Not on file   Social Drivers of Health   Financial Resource Strain: Not on file  Food Insecurity: Not on file  Transportation Needs: Not on file  Physical Activity: Not on file  Stress: Not on file (08/21/2023)  Social Connections: Not on file  Intimate Partner Violence: Not on file   Family History  Problem Relation Age of Onset   Hypertension Mother    Past Surgical History:  Procedure Laterality Date   CESAREAN SECTION N/A 12/06/2018    Procedure: CESAREAN SECTION;  Surgeon: Levie Heritage, DO;  Location: [facility] LD ORS;  Service: Obstetrics;  Laterality: N/AMardella Layman, MD 09/30/23 1929

## 2023-09-30 NOTE — Discharge Instructions (Signed)
HOME CARE INSTRUCTIONS: For many people, back pain returns. Since low back pain is rarely dangerous, it is often a condition that people can learn to manage on their own. Please remain active. It is stressful on the back to sit or stand in one place. Do not sit, drive, or stand in one place for more than 30 minutes at a time. Take short walks on level surfaces as soon as pain allows. Try to increase the length of time you walk each day. Do not stay in bed. Resting more than 1 or 2 days can delay your recovery. Do not avoid exercise or work. Your body is made to move. It is not dangerous to be active, even though your back may hurt. Your back will likely heal faster if you return to being active before your pain is gone. Over-the-counter medicines to reduce pain and inflammation are often the most helpful. ° °SEEK MEDICAL CARE IF: °You have pain that is not relieved with rest or medicine. °You have pain that does not improve in 1 week. °You have new symptoms. °You are generally not feeling well. ° °SEEK IMMEDIATE MEDICAL CARE IF: °You have pain that radiates from your back into your legs. °You develop new bowel or bladder control problems. °You have unusual weakness or numbness in your arms or legs. °You develop nausea or vomiting. °You develop abdominal pain. °You feel faint. °

## 2023-09-30 NOTE — ED Triage Notes (Signed)
Patient presents with body aches, low back pain, states when she lift at work, having pain under arm area, pain radiates in right chest and right shoulder. Pain started yesterday. Patient states she believe she pulled a muscle. Treated with Ibuprofen, helped a little. Patient states she need a work note for missing today.

## 2024-08-30 ENCOUNTER — Ambulatory Visit: Admission: EM | Admit: 2024-08-30 | Discharge: 2024-08-30 | Disposition: A | Discharge status: Home / Self Care

## 2024-08-30 ENCOUNTER — Encounter: Payer: Self-pay | Admitting: Emergency Medicine

## 2024-08-30 DIAGNOSIS — J069 Acute upper respiratory infection, unspecified: Secondary | ICD-10-CM | POA: Diagnosis not present

## 2024-08-30 NOTE — Discharge Instructions (Signed)

## 2024-08-30 NOTE — ED Provider Notes (Signed)
 [facility]    CSN: [id number] Arrival date & time: 08/30/24  1522      History   Chief Complaint Chief Complaint  Patient presents with   Headache   Otalgia   Cough    HPI Brenda Simon is a 33 y.o. female.   Pt presents today due to nasal congestion, cough, throat pain, and nasal drainage that started Monday. Pt states that her daughter started with symptoms first. Pt denies use of medicine for symptoms. Pt denies fever, chills, or changes in appetite.   The history is provided by the patient.  Headache Associated symptoms: cough and ear pain   Otalgia Associated symptoms: cough and headaches   Cough Associated symptoms: ear pain and headaches     Past Medical History:  Diagnosis Date   Chlamydia    Sickle cell trait     Patient Active Problem List   Diagnosis Date Noted   Oligohydramnios 12/05/2018   Depression 12/10/2012    Past Surgical History:  Procedure Laterality Date   CESAREAN SECTION N/A 12/06/2018   Procedure: CESAREAN SECTION;  Surgeon: Barbra Lang PARAS, DO;  Location: [facility] LD [facility];  Service: Obstetrics;  Laterality: N/A;    OB History     Gravida  1   Para  1   Term  1   Preterm      AB      Living  1      SAB      IAB      Ectopic      Multiple  0   Live Births  1            Home Medications    Prior to Admission medications   Medication Sig Start Date End Date Taking? Authorizing Provider  cyclobenzaprine  (FLEXERIL ) 10 MG tablet Take 1 tablet by mouth 3 times daily as needed for muscle spasm. Warning: May cause drowsiness. Patient not taking: Reported on 08/30/2024 09/30/23   Rolinda Rogue, MD  fluconazole  (DIFLUCAN ) 150 MG tablet Take one tablet. Take another if no improvement in 3 days. Patient not taking: Reported on 12/30/2021 01/20/21   Vear, Amy L, PA  fluticasone  (FLONASE ) 50 MCG/ACT nasal spray Place 1 spray into both nostrils daily. 09/16/22   Hazen Darryle BRAVO, FNP  ibuprofen  (ADVIL ) 800 MG  tablet Take 1 tablet (800 mg total) by mouth 3 (three) times daily with meals. 09/30/23   Rolinda Rogue, MD  ondansetron  (ZOFRAN -ODT) 4 MG disintegrating tablet Take 1 tablet (4 mg total) by mouth every 8 (eight) hours as needed for nausea or vomiting. Patient not taking: Reported on 08/30/2024 08/04/23   Hazen Darryle BRAVO, FNP  oseltamivir  (TAMIFLU ) 75 MG capsule Take 1 capsule (75 mg total) by mouth every 12 (twelve) hours. Patient not taking: Reported on 12/30/2021 08/05/21   Billy Asberry FALCON, PA-C    Family History Family History  Problem Relation Age of Onset   Hypertension Mother     Social History Social History   Tobacco Use   Smoking status: Former    Types: Cigars    Passive exposure: Past   Smokeless tobacco: Never   Tobacco comments:    stopped after pos. UPT  Vaping Use   Vaping status: Former  Substance Use Topics   Alcohol use: Yes    Comment: occasionally   Drug use: Not Currently    Types: Marijuana     Allergies   Patient has no known allergies.   Review of  Systems Review of Systems  HENT:  Positive for ear pain.   Respiratory:  Positive for cough.   Neurological:  Positive for headaches.     Physical Exam Triage Vital Signs ED Triage Vitals  Encounter Vitals Group     BP 08/30/24 1719 122/86     Girls Systolic BP Percentile --      Girls Diastolic BP Percentile --      Boys Systolic BP Percentile --      Boys Diastolic BP Percentile --      Pulse Rate 08/30/24 1719 93     Resp 08/30/24 1719 18     Temp 08/30/24 1719 98.8 F (37.1 C)     Temp Source 08/30/24 1719 Oral     SpO2 08/30/24 1719 97 %     Weight --      Height --      Head Circumference --      Peak Flow --      Pain Score 08/30/24 1720 7     Pain Loc --      Pain Education --      Exclude from Growth Chart --    No data found.  Updated Vital Signs BP 122/86 (BP Location: Left Arm)   Pulse 93   Temp 98.8 F (37.1 C) (Oral)   Resp 18   LMP 06/23/2024 (Approximate)    SpO2 97%   Visual Acuity Right Eye Distance:   Left Eye Distance:   Bilateral Distance:    Right Eye Near:   Left Eye Near:    Bilateral Near:     Physical Exam Vitals and nursing note reviewed.  Constitutional:      General: She is not in acute distress.    Appearance: Normal appearance. She is not ill-appearing, toxic-appearing or diaphoretic.  HENT:     Nose: Congestion (moderately enlarged turbinates) present. No rhinorrhea.     Mouth/Throat:     Mouth: Mucous membranes are moist.     Pharynx: Oropharynx is clear. No oropharyngeal exudate or posterior oropharyngeal erythema.  Eyes:     General: No scleral icterus. Cardiovascular:     Rate and Rhythm: Normal rate and regular rhythm.     Heart sounds: Normal heart sounds.  Pulmonary:     Effort: Pulmonary effort is normal. No respiratory distress.     Breath sounds: Normal breath sounds. No wheezing or rhonchi.  Skin:    General: Skin is warm.  Neurological:     Mental Status: She is alert and oriented to person, place, and time.  Psychiatric:        Mood and Affect: Mood normal.        Behavior: Behavior normal.      UC Treatments / Results  Labs (all labs ordered are listed, but only abnormal results are displayed) Labs Reviewed - No data to display  EKG   Radiology No results found.  Procedures Procedures (including critical care time)  Medications Ordered in UC Medications - No data to display  Initial Impression / Assessment and Plan / UC Course  I have reviewed the triage vital signs and the nursing notes.  Pertinent labs & imaging results that were available during my care of the patient were reviewed by me and considered in my medical decision making (see chart for details).    Final Clinical Impressions(s) / UC Diagnoses   Final diagnoses:  Viral URI     Discharge Instructions      You been  diagnosed with a viral illness today. -Viruses have to run their course and medicines that  are prescribed are meant to help with symptoms. - With viruses usually feel poorly from 3 to 7 days with cough being the last symptoms to resolve.  -Cough can linger from days to weeks.  Antibiotics are not effective for viruses. -If your cough lasts more than 2 weeks and you are coughing so hard that you are vomiting or feel like you could pass out we need to follow-up with PCP for further testing and evaluation. -Rest, increase water  intake, may use pseudoephedrine for nasal congestion, Delsym (dextromethorphan) or honey as needed for cough, and ibuprofen  and/or Tylenol  as directed on packaging for pain and fever. -If you have hypertension you should take Coricidin or other OTC meds approved for people with high blood pressure. -You may use a spoonful of honey every 4-6 hours as needed for throat pain and cough. -Warm tea with honey and lemon are helpful for soothe throat as well.  Chloraseptic and Cepacol make a throat lozenge with numbing medication, can be purchased over-the-counter. -May also use Flonase  or sinus rinse for sinus pressure or nasal congestion.  Be sure to use distilled bottled water  for sinus rinses. -May use coolmist humidifier to open up nasal passages -May elevate head to assist with postnasal drainage. -If you feel poorly (fever, fatigue, shortness of breath, nausea, etc.) for more than 10 days to be sure to follow-up with PCP or in clinic for further evaluation and additional treatments. If you experience chest pain with shortness of breath or pulse oxygen less than 95% you should report to the ER.     ED Prescriptions   None    PDMP not reviewed this encounter.   Andra Corean BROCKS, PA-C 08/30/24 1828

## 2024-08-30 NOTE — ED Triage Notes (Signed)
 Pt reports dry cough, headache, runny nose, sore throat, and throbbing R ear pain x3 days. Denies fevers, chills, and body aches. No medication use for symptoms.

## 2024-10-28 ENCOUNTER — Encounter: Payer: Self-pay | Admitting: Emergency Medicine

## 2024-10-28 ENCOUNTER — Ambulatory Visit
Admission: EM | Admit: 2024-10-28 | Discharge: 2024-10-28 | Disposition: A | Discharge status: Home / Self Care | Attending: Student | Admitting: Student

## 2024-10-28 DIAGNOSIS — L089 Local infection of the skin and subcutaneous tissue, unspecified: Secondary | ICD-10-CM

## 2024-10-28 MED ORDER — DOXYCYCLINE HYCLATE 100 MG PO CAPS: 100.0000 mg | ORAL_CAPSULE | Freq: Two times a day (BID) | ORAL | 0 refills | Status: AC

## 2024-10-28 NOTE — ED Triage Notes (Signed)
 Patient has a bump on her face by the nose that she drains and puss come out.  Bump has been there since the end of October.  Patient has been putting a hot rag on it.

## 2024-10-28 NOTE — ED Provider Notes (Signed)
 " [facility]    CSN: [id number] Arrival date & time: 10/28/24  1312      History   Chief Complaint Chief Complaint  Patient presents with   bump on face    HPI Brenda Simon is a 34 y.o. female presenting w facial bump.  Patient has a bump on her face by the nose that she drains and puss come out.  Bump has been there since the end of October.  Patient has been putting a hot rag on it.  States she has also had abscesses on other parts of the body, but never the face before this.  HPI  Past Medical History:  Diagnosis Date   Chlamydia    Sickle cell trait     Patient Active Problem List   Diagnosis Date Noted   Oligohydramnios 12/05/2018   Depression 12/10/2012    Past Surgical History:  Procedure Laterality Date   CESAREAN SECTION N/A 12/06/2018   Procedure: CESAREAN SECTION;  Surgeon: Barbra Lang PARAS, DO;  Location: [facility];  Service: Obstetrics;  Laterality: N/A;    OB History     Gravida  1   Para  1   Term  1   Preterm      AB      Living  1      SAB      IAB      Ectopic      Multiple  0   Live Births  1            Home Medications    Prior to Admission medications  Medication Sig Start Date End Date Taking? Authorizing Provider  doxycycline  (VIBRAMYCIN ) 100 MG capsule Take 1 capsule (100 mg total) by mouth 2 (two) times daily for 7 days. 10/28/24 11/04/24 Yes Dayami Taitt E, PA-C  ibuprofen  (ADVIL ) 800 MG tablet Take 1 tablet (800 mg total) by mouth 3 (three) times daily with meals. 09/30/23   Rolinda Rogue, MD    Family History Family History  Problem Relation Age of Onset   Hypertension Mother     Social History Social History[1]   Allergies   Patient has no known allergies.   Review of Systems Review of Systems  Musculoskeletal:        Facial cyst     Physical Exam Triage Vital Signs ED Triage Vitals  Encounter Vitals Group     BP 10/28/24 1358 128/83     Girls Systolic BP Percentile --       Girls Diastolic BP Percentile --      Boys Systolic BP Percentile --      Boys Diastolic BP Percentile --      Pulse Rate 10/28/24 1358 84     Resp 10/28/24 1358 16     Temp 10/28/24 1358 98.4 F (36.9 C)     Temp Source 10/28/24 1358 Oral     SpO2 10/28/24 1358 96 %     Weight --      Height --      Head Circumference --      Peak Flow --      Pain Score 10/28/24 1356 0     Pain Loc --      Pain Education --      Exclude from Growth Chart --    No data found.  Updated Vital Signs BP 128/83 (BP Location: Left Arm)   Pulse 84   Temp 98.4 F (36.9 C) (Oral)  Resp 16   LMP 10/13/2024 (Exact Date)   SpO2 96%   Visual Acuity Right Eye Distance:   Left Eye Distance:   Bilateral Distance:    Right Eye Near:   Left Eye Near:    Bilateral Near:     Physical Exam Vitals reviewed.  Constitutional:      General: She is not in acute distress.    Appearance: Normal appearance. She is not ill-appearing.  HENT:     Head: Normocephalic and atraumatic.  Pulmonary:     Effort: Pulmonary effort is normal.  Skin:    Comments: See image below Bridge of nose with 8 mm round cyst.  Soft, tender to palpation.  Small amount of fluctuance.  Neurological:     General: No focal deficit present.     Mental Status: She is alert and oriented to person, place, and time.  Psychiatric:        Mood and Affect: Mood normal.        Behavior: Behavior normal.        Thought Content: Thought content normal.        Judgment: Judgment normal.       UC Treatments / Results  Labs (all labs ordered are listed, but only abnormal results are displayed) Labs Reviewed - No data to display  EKG   Radiology No results found.  Procedures Procedures (including critical care time)  Medications Ordered in UC Medications - No data to display  Initial Impression / Assessment and Plan / UC Course  I have reviewed the triage vital signs and the nursing notes.  Pertinent labs & imaging  results that were available during my care of the patient were reviewed by me and considered in my medical decision making (see chart for details).     Patient is a pleasant 35 y.o. female presenting with cyst on bridge of nose. The patient is afebrile and nontachycardic.  Antipyretic has not been administered today.  LMP 10/13/2024, states she has not had intercourse since her period, and she is not breast-feeding.  Doxycycline  sent.  Continue warm compresses.  Follow-up with Derm; referral sent.  Final Clinical Impressions(s) / UC Diagnoses   Final diagnoses:  Facial infection     Discharge Instructions      -Doxycycline  twice daily for 7 days.  Make sure to wear sunscreen while spending time outside while on this medication as it can increase your chance of sunburn. You can take this medication with food if you have a sensitive stomach. -Continue warm compresses - I have placed a referral to dermatology.  If you do not hear from them within about 1 week, then call the number below to schedule this follow-up. -If your symptoms worsen before you can see dermatology, including worsening swelling, worsening pain, new fevers; follow-up with us  or the emergency department.     ED Prescriptions     Medication Sig Dispense Auth. Provider   doxycycline  (VIBRAMYCIN ) 100 MG capsule Take 1 capsule (100 mg total) by mouth 2 (two) times daily for 7 days. 14 capsule Martrell Eguia E, PA-C      PDMP not reviewed this encounter.     [1]  Social History Tobacco Use   Smoking status: Former    Types: Cigars    Passive exposure: Past   Smokeless tobacco: Never   Tobacco comments:    stopped after pos. UPT  Vaping Use   Vaping status: Former  Substance Use Topics   Alcohol use: Yes  Comment: occasionally   Drug use: Not Currently    Types: Marijuana     Arlyss Leita BRAVO, PA-C 10/28/24 1446  "

## 2024-10-28 NOTE — Discharge Instructions (Addendum)
-  Doxycycline  twice daily for 7 days.  Make sure to wear sunscreen while spending time outside while on this medication as it can increase your chance of sunburn. You can take this medication with food if you have a sensitive stomach. -Continue warm compresses - I have placed a referral to dermatology.  If you do not hear from them within about 1 week, then call the number below to schedule this follow-up. -If your symptoms worsen before you can see dermatology, including worsening swelling, worsening pain, new fevers; follow-up with us  or the emergency department.

## 2024-11-08 ENCOUNTER — Ambulatory Visit: Admission: EM | Admit: 2024-11-08 | Discharge: 2024-11-08 | Disposition: A | Discharge status: Home / Self Care

## 2024-11-08 ENCOUNTER — Encounter: Payer: Self-pay | Admitting: Emergency Medicine

## 2024-11-08 DIAGNOSIS — L0231 Cutaneous abscess of buttock: Secondary | ICD-10-CM | POA: Diagnosis not present

## 2024-11-08 MED ORDER — SULFAMETHOXAZOLE-TRIMETHOPRIM 800-160 MG PO TABS: 1.0000 | ORAL_TABLET | Freq: Two times a day (BID) | ORAL | 0 refills | Status: AC

## 2024-11-08 NOTE — Discharge Instructions (Signed)
 Be sure to complete antibiotics in their entirety  Soak in warm water  20 mins at a time a couple times a day.

## 2024-11-08 NOTE — ED Triage Notes (Signed)
 Pt presents c/o abscess x 7 days. Pt states,  I have an abscess on my bottom that has spread down my butt cheek and towards my vaginal area. It is very painful and I can barely sit down.

## 2024-11-09 DIAGNOSIS — L0231 Cutaneous abscess of buttock: Secondary | ICD-10-CM

## 2024-11-09 NOTE — ED Provider Notes (Signed)
 " [facility]    CSN: [id number] Arrival date & time: 11/08/24  1850      History   Chief Complaint Chief Complaint  Patient presents with   Abscess    HPI Brenda Simon is a 34 y.o. female.   Pt presents today due to 7 days worth of painful abscess of right glute.   The history is provided by the patient.  Abscess   Past Medical History:  Diagnosis Date   Chlamydia    Sickle cell trait     Patient Active Problem List   Diagnosis Date Noted   Oligohydramnios 12/05/2018   Depression 12/10/2012    Past Surgical History:  Procedure Laterality Date   CESAREAN SECTION N/A 12/06/2018   Procedure: CESAREAN SECTION;  Surgeon: Barbra Lang PARAS, DO;  Location: [facility];  Service: Obstetrics;  Laterality: N/A;    OB History     Gravida  1   Para  1   Term  1   Preterm      AB      Living  1      SAB      IAB      Ectopic      Multiple  0   Live Births  1            Home Medications    Prior to Admission medications  Medication Sig Start Date End Date Taking? Authorizing Provider  sulfamethoxazole -trimethoprim  (BACTRIM  DS) 800-160 MG tablet Take 1 tablet by mouth 2 (two) times daily for 10 days. 11/08/24 11/18/24 Yes Andra Krabbe C, PA-C  ibuprofen  (ADVIL ) 800 MG tablet Take 1 tablet (800 mg total) by mouth 3 (three) times daily with meals. 09/30/23   Rolinda Rogue, MD    Family History Family History  Problem Relation Age of Onset   Hypertension Mother     Social History Social History[1]   Allergies   Patient has no known allergies.   Review of Systems Review of Systems   Physical Exam Triage Vital Signs ED Triage Vitals  Encounter Vitals Group     BP 11/08/24 1905 (!) 130/90     Girls Systolic BP Percentile --      Girls Diastolic BP Percentile --      Boys Systolic BP Percentile --      Boys Diastolic BP Percentile --      Pulse Rate 11/08/24 1905 100     Resp 11/08/24 1905 16     Temp 11/08/24  1905 98.4 F (36.9 C)     Temp Source 11/08/24 1905 Oral     SpO2 11/08/24 1905 98 %     Weight 11/08/24 1905 167 lb 15.9 oz (76.2 kg)     Height --      Head Circumference --      Peak Flow --      Pain Score 11/08/24 1904 10     Pain Loc --      Pain Education --      Exclude from Growth Chart --    No data found.  Updated Vital Signs BP (!) 130/90 (BP Location: Left Arm)   Pulse 100   Temp 98.4 F (36.9 C) (Oral)   Resp 16   Wt 167 lb 15.9 oz (76.2 kg)   LMP 10/13/2024 (Exact Date)   SpO2 98%   BMI 28.84 kg/m   Visual Acuity Right Eye Distance:   Left Eye Distance:   Bilateral Distance:  Right Eye Near:   Left Eye Near:    Bilateral Near:     Physical Exam Vitals and nursing note reviewed.  Constitutional:      General: She is not in acute distress.    Appearance: Normal appearance. She is not ill-appearing, toxic-appearing or diaphoretic.  Eyes:     General: No scleral icterus. Cardiovascular:     Rate and Rhythm: Normal rate and regular rhythm.     Heart sounds: Normal heart sounds.  Pulmonary:     Effort: Pulmonary effort is normal. No respiratory distress.     Breath sounds: Normal breath sounds. No wheezing or rhonchi.  Skin:    General: Skin is warm.     Findings: Abscess present.         Comments: Tender, fluctuant, indurated, raised lesion of right glute  Neurological:     Mental Status: She is alert and oriented to person, place, and time.  Psychiatric:        Mood and Affect: Mood normal.        Behavior: Behavior normal.      UC Treatments / Results  Labs (all labs ordered are listed, but only abnormal results are displayed) Labs Reviewed - No data to display  EKG   Radiology No results found.  Procedures Incision and Drainage  Date/Time: 11/09/2024 8:14 AM  Performed by: Andra Corean BROCKS, PA-C Authorized by: Andra Corean BROCKS, PA-C   Consent:    Consent obtained:  Verbal   Consent given by:  Patient   Risks,  benefits, and alternatives were discussed: yes     Risks discussed:  Pain and bleeding   Alternatives discussed:  No treatment Universal protocol:    Procedure explained and questions answered to patient or proxy's satisfaction: yes     Patient identity confirmed:  Verbally with patient and provided demographic data Location:    Type:  Abscess   Location:  Lower extremity   Lower extremity location:  Buttock   Buttock location:  R buttock Pre-procedure details:    Skin preparation:  Antiseptic wash Anesthesia:    Anesthesia method:  Topical application   Topical anesthesia: pain ease. Procedure type:    Complexity:  Simple Procedure details:    Ultrasound guidance: no     Needle aspiration: no     Incision types:  Stab incision   Incision depth:  Dermal   Drainage:  Purulent and bloody   Drainage amount:  Moderate   Wound treatment:  Wound left open   Packing materials:  None Post-procedure details:    Procedure completion:  Tolerated well, no immediate complications  (including critical care time)  Medications Ordered in UC Medications - No data to display  Initial Impression / Assessment and Plan / UC Course  I have reviewed the triage vital signs and the nursing notes.  Pertinent labs & imaging results that were available during my care of the patient were reviewed by me and considered in my medical decision making (see chart for details).     Final Clinical Impressions(s) / UC Diagnoses   Final diagnoses:  Abscess of right buttock     Discharge Instructions      Be sure to complete antibiotics in their entirety  Soak in warm water  20 mins at a time a couple times a day.     ED Prescriptions     Medication Sig Dispense Auth. Provider   sulfamethoxazole -trimethoprim  (BACTRIM  DS) 800-160 MG tablet Take 1 tablet by mouth 2 (  two) times daily for 10 days. 20 tablet Andra Corean BROCKS, PA-C      PDMP not reviewed this encounter.    [1]  Social  History Tobacco Use   Smoking status: Former    Types: Cigars    Passive exposure: Past   Smokeless tobacco: Never   Tobacco comments:    stopped after pos. UPT  Vaping Use   Vaping status: Former  Substance Use Topics   Alcohol use: Yes    Comment: occasionally   Drug use: Not Currently    Types: Marijuana     Andra Corean BROCKS, PA-C 11/09/24 [phone]  "
